# Patient Record
Sex: Male | Born: 1955 | Race: Black or African American | Hispanic: No | Smoking: Current some day smoker
Health system: Southern US, Community
[De-identification: ages and names within clinical notes are randomized; demographics above are authoritative.]

## PROBLEM LIST (undated history)

## (undated) DIAGNOSIS — K219 Gastro-esophageal reflux disease without esophagitis: Secondary | ICD-10-CM

## (undated) DIAGNOSIS — I10 Essential (primary) hypertension: Secondary | ICD-10-CM

## (undated) DIAGNOSIS — E785 Hyperlipidemia, unspecified: Secondary | ICD-10-CM

## (undated) HISTORY — DX: Hyperlipidemia, unspecified: E78.5

## (undated) HISTORY — DX: Gastro-esophageal reflux disease without esophagitis: K21.9

## (undated) HISTORY — PX: HERNIA REPAIR: SHX51

## (undated) HISTORY — DX: Essential (primary) hypertension: I10

---

## 2019-09-23 ENCOUNTER — Encounter (HOSPITAL_COMMUNITY): Payer: Self-pay | Admitting: Emergency Medicine

## 2019-09-23 ENCOUNTER — Other Ambulatory Visit: Payer: Self-pay

## 2019-09-23 ENCOUNTER — Ambulatory Visit (HOSPITAL_COMMUNITY)
Admission: EM | Admit: 2019-09-23 | Discharge: 2019-09-23 | Disposition: A | Discharge status: Home / Self Care | Payer: Medicaid Other

## 2019-09-23 DIAGNOSIS — S81811A Laceration without foreign body, right lower leg, initial encounter: Secondary | ICD-10-CM

## 2019-09-23 NOTE — ED Triage Notes (Signed)
Pt presents to [facility] for assessment after he was holding a mirror with suction cups, and it slipped off and he was rattled trying to catch it before it hit the ground last week.  Patient states he hascramping to the left thigh.  Also, laceration to the right shin, bleeding controlled./

## 2019-09-23 NOTE — Discharge Instructions (Addendum)
The wound is healing on its own and is not infected. Since it has been a week we cannot sew the wound up. Keep clean and covered

## 2019-09-23 NOTE — ED Provider Notes (Signed)
[facility]    CSN: [id number] Arrival date & time: 09/23/19  1058      History   Chief Complaint Chief Complaint  Patient presents with  . Laceration    HPI Randall Velasquez is a 64 y.o. male.   Patient is a 64 year old male who presents today with laceration to right lower extremity.  This occurred approximately 1 week ago when he was holding a mirror and cut the bottom of his leg.  Otherwise he has no complaints.  Denies any significant pain, redness, swelling to the site.  He reports he is up-to-date on his tetanus.     History reviewed. No pertinent past medical history.  There are no problems to display for this patient.   History reviewed. No pertinent surgical history.     Home Medications    Prior to Admission medications   Not on File    Family History History reviewed. No pertinent family history.  Social History Social History   Tobacco Use  . Smoking status: Current Every Day Smoker    Packs/day: 2.00    Types: Cigars  . Smokeless tobacco: Never Used  Substance Use Topics  . Alcohol use: Yes    Comment: ever now and again  . Drug use: Not Currently     Allergies   Patient has no known allergies.   Review of Systems Review of Systems   Physical Exam Triage Vital Signs ED Triage Vitals [09/23/19 1254]  Enc Vitals Group     BP (!) 154/95     Pulse Rate 61     Resp 18     Temp 98 F (36.7 C)     Temp Source Oral     SpO2 98 %     Weight      Height      Head Circumference      Peak Flow      Pain Score 10     Pain Loc      Pain Edu?      Excl. in GC?    No data found.  Updated Vital Signs BP (!) 154/95 (BP Location: Right Arm)   Pulse 61   Temp 98 F (36.7 C) (Oral)   Resp 18   SpO2 98%   Visual Acuity Right Eye Distance:   Left Eye Distance:   Bilateral Distance:    Right Eye Near:   Left Eye Near:    Bilateral Near:     Physical Exam Vitals and nursing note reviewed.  Constitutional:       Appearance: Normal appearance.  HENT:     Head: Normocephalic and atraumatic.     Nose: Nose normal.  Eyes:     Conjunctiva/sclera: Conjunctivae normal.  Pulmonary:     Effort: Pulmonary effort is normal.  Musculoskeletal:        General: Normal range of motion.     Cervical back: Normal range of motion.  Skin:    General: Skin is warm and dry.       Neurological:     Mental Status: He is alert.  Psychiatric:        Mood and Affect: Mood normal.      UC Treatments / Results  Labs (all labs ordered are listed, but only abnormal results are displayed) Labs Reviewed - No data to display  EKG   Radiology No results found.  Procedures Procedures (including critical care time)  Medications Ordered in UC Medications - No data  to display  Initial Impression / Assessment and Plan / UC Course  I have reviewed the triage vital signs and the nursing notes.  Pertinent labs & imaging results that were available during my care of the patient were reviewed by me and considered in my medical decision making (see chart for details).     Laceration to right lower extremity that is healing No concerns for infection. Out of the window for sutures. Recommend keep clean and wrapped Follow up as needed for continued or worsening symptoms  Final Clinical Impressions(s) / UC Diagnoses   Final diagnoses:  Laceration of right lower extremity, initial encounter     Discharge Instructions     The wound is healing on its own and is not infected. Since it has been a week we cannot sew the wound up. Keep clean and covered    ED Prescriptions    None     PDMP not reviewed this encounter.   Janace Aris, NP 09/23/19 1333

## 2019-09-30 DIAGNOSIS — G8929 Other chronic pain: Secondary | ICD-10-CM | POA: Diagnosis not present

## 2019-09-30 DIAGNOSIS — K219 Gastro-esophageal reflux disease without esophagitis: Secondary | ICD-10-CM | POA: Diagnosis not present

## 2019-09-30 DIAGNOSIS — I10 Essential (primary) hypertension: Secondary | ICD-10-CM | POA: Diagnosis not present

## 2019-09-30 DIAGNOSIS — M546 Pain in thoracic spine: Secondary | ICD-10-CM | POA: Diagnosis not present

## 2019-09-30 DIAGNOSIS — R229 Localized swelling, mass and lump, unspecified: Secondary | ICD-10-CM | POA: Diagnosis not present

## 2019-09-30 DIAGNOSIS — S81811S Laceration without foreign body, right lower leg, sequela: Secondary | ICD-10-CM | POA: Diagnosis not present

## 2019-10-08 DIAGNOSIS — I10 Essential (primary) hypertension: Secondary | ICD-10-CM | POA: Diagnosis not present

## 2019-10-08 DIAGNOSIS — R252 Cramp and spasm: Secondary | ICD-10-CM | POA: Diagnosis not present

## 2019-10-08 DIAGNOSIS — R7989 Other specified abnormal findings of blood chemistry: Secondary | ICD-10-CM | POA: Diagnosis not present

## 2019-10-16 DIAGNOSIS — R944 Abnormal results of kidney function studies: Secondary | ICD-10-CM | POA: Diagnosis not present

## 2019-11-13 DIAGNOSIS — R220 Localized swelling, mass and lump, head: Secondary | ICD-10-CM | POA: Diagnosis not present

## 2020-01-04 DIAGNOSIS — Z23 Encounter for immunization: Secondary | ICD-10-CM | POA: Diagnosis not present

## 2020-01-04 DIAGNOSIS — N289 Disorder of kidney and ureter, unspecified: Secondary | ICD-10-CM | POA: Diagnosis not present

## 2020-01-04 DIAGNOSIS — M545 Low back pain, unspecified: Secondary | ICD-10-CM | POA: Diagnosis not present

## 2020-01-04 DIAGNOSIS — I10 Essential (primary) hypertension: Secondary | ICD-10-CM | POA: Diagnosis not present

## 2020-01-25 DIAGNOSIS — N289 Disorder of kidney and ureter, unspecified: Secondary | ICD-10-CM | POA: Diagnosis not present

## 2020-01-25 DIAGNOSIS — I10 Essential (primary) hypertension: Secondary | ICD-10-CM | POA: Diagnosis not present

## 2020-01-25 DIAGNOSIS — G8929 Other chronic pain: Secondary | ICD-10-CM | POA: Diagnosis not present

## 2020-01-25 DIAGNOSIS — I7300 Raynaud's syndrome without gangrene: Secondary | ICD-10-CM | POA: Diagnosis not present

## 2020-01-25 DIAGNOSIS — M545 Low back pain, unspecified: Secondary | ICD-10-CM | POA: Diagnosis not present

## 2020-02-13 DIAGNOSIS — D689 Coagulation defect, unspecified: Secondary | ICD-10-CM

## 2020-02-13 HISTORY — DX: Coagulation defect, unspecified: D68.9

## 2020-05-05 DIAGNOSIS — I739 Peripheral vascular disease, unspecified: Secondary | ICD-10-CM | POA: Diagnosis not present

## 2020-05-05 DIAGNOSIS — I10 Essential (primary) hypertension: Secondary | ICD-10-CM | POA: Diagnosis not present

## 2020-05-05 DIAGNOSIS — N289 Disorder of kidney and ureter, unspecified: Secondary | ICD-10-CM | POA: Diagnosis not present

## 2020-05-09 ENCOUNTER — Other Ambulatory Visit: Payer: Self-pay | Admitting: Internal Medicine

## 2020-05-09 DIAGNOSIS — I739 Peripheral vascular disease, unspecified: Secondary | ICD-10-CM

## 2020-05-12 ENCOUNTER — Ambulatory Visit
Admission: RE | Admit: 2020-05-12 | Discharge: 2020-05-12 | Disposition: A | Discharge status: Home / Self Care | Payer: Medicaid Other | Source: Ambulatory Visit | Attending: Internal Medicine | Admitting: Internal Medicine

## 2020-05-12 DIAGNOSIS — I70213 Atherosclerosis of native arteries of extremities with intermittent claudication, bilateral legs: Secondary | ICD-10-CM | POA: Diagnosis not present

## 2020-05-12 DIAGNOSIS — I739 Peripheral vascular disease, unspecified: Secondary | ICD-10-CM

## 2020-05-19 DIAGNOSIS — I739 Peripheral vascular disease, unspecified: Secondary | ICD-10-CM | POA: Insufficient documentation

## 2020-05-26 DIAGNOSIS — G8929 Other chronic pain: Secondary | ICD-10-CM | POA: Insufficient documentation

## 2020-05-26 DIAGNOSIS — Z131 Encounter for screening for diabetes mellitus: Secondary | ICD-10-CM | POA: Diagnosis not present

## 2020-05-26 DIAGNOSIS — F17200 Nicotine dependence, unspecified, uncomplicated: Secondary | ICD-10-CM | POA: Insufficient documentation

## 2020-05-26 DIAGNOSIS — Z13220 Encounter for screening for lipoid disorders: Secondary | ICD-10-CM | POA: Diagnosis not present

## 2020-05-30 DIAGNOSIS — N183 Chronic kidney disease, stage 3 unspecified: Secondary | ICD-10-CM | POA: Insufficient documentation

## 2020-07-08 DIAGNOSIS — I739 Peripheral vascular disease, unspecified: Secondary | ICD-10-CM | POA: Diagnosis not present

## 2020-09-27 ENCOUNTER — Ambulatory Visit (INDEPENDENT_AMBULATORY_CARE_PROVIDER_SITE_OTHER): Payer: Medicaid Other

## 2020-09-27 ENCOUNTER — Other Ambulatory Visit: Payer: Self-pay

## 2020-09-27 ENCOUNTER — Encounter: Payer: Self-pay | Admitting: Family Medicine

## 2020-09-27 ENCOUNTER — Ambulatory Visit (INDEPENDENT_AMBULATORY_CARE_PROVIDER_SITE_OTHER): Payer: Medicaid Other | Admitting: Family Medicine

## 2020-09-27 ENCOUNTER — Ambulatory Visit: Payer: Self-pay

## 2020-09-27 VITALS — BP 102/74 | HR 57 | Ht 71.0 in | Wt 168.2 lb

## 2020-09-27 DIAGNOSIS — G8929 Other chronic pain: Secondary | ICD-10-CM | POA: Diagnosis not present

## 2020-09-27 DIAGNOSIS — I739 Peripheral vascular disease, unspecified: Secondary | ICD-10-CM

## 2020-09-27 DIAGNOSIS — M25562 Pain in left knee: Secondary | ICD-10-CM

## 2020-09-27 DIAGNOSIS — M545 Low back pain, unspecified: Secondary | ICD-10-CM | POA: Diagnosis not present

## 2020-09-27 DIAGNOSIS — M5416 Radiculopathy, lumbar region: Secondary | ICD-10-CM

## 2020-09-27 MED ORDER — PREGABALIN 75 MG PO CAPS: 75.0000 mg | ORAL_CAPSULE | Freq: Two times a day (BID) | ORAL | 3 refills | Status: DC | PRN

## 2020-09-27 NOTE — Progress Notes (Signed)
Subjective:    CC: L knee pain  I, Randall Velasquez, LAT, ATC, am serving as scribe for Dr. [name].  HPI: Pt is a 65 y/o male presenting w/ c/o L knee pain x one year w/ no known MOI .  He locates his pain to his L ant-lat knee w/ radiating pain into his L post-lat calf .  Patient has had some treatment for this with his primary care provider at [facility] since October 2021 with trials of home exercise program, and trial of gabapentin with little benefit.  Ultimately he did have some evaluation and was determined to have peripheral arterial disease and is currently followed by [facility] cardiology and managed with Pletal.  He does have exertional calf pain.  However his pain is worsening and feels different than it has felt previously.  He notes paresthesias in his leg as well.  L knee swelling: intermittently yes L knee mechanical symptoms: yes L LE numbness/tingling: yes from his L foot travelling proximally to his L lower leg Aggravating factors: walking; climbing stairs; pain worse at night Treatments tried: Gabapentin; Tylenol; prior L knee steroid injection years ago  Pertinent review of Systems: No fevers or chills  Relevant historical information: History of PAD involving left leg with ABI 0.9 with monophasic waveform.  Evaluated by [facility] cardiology with plan for medical management with Pletal.  Patient is not satisfied and wishes for second opinion/to transfer care to [facility] physician   Objective:    Vitals:   09/27/20 0919  BP: 102/74  Pulse: (!) 57  SpO2: 97%   General: Well Developed, well nourished, and in no acute distress.   MSK:  L-spine: Nontender midline.  Decreased lumbar motion. Positive left-sided slump test. Intact strength lower extremity. Left hip normal.  Normal motion nontender. Left knee mild effusion normal motion with crepitation.  Tender palpation medial joint line. Stable to commence exam. Intact strength. Left calf normal.   Nontender. Decreased pulses left leg.   Lab and Radiology Results  Procedure: Real-time Ultrasound Guided Injection of left knee superior lateral patellar space Device: Philips Affiniti 50G Images permanently stored and available for review in PACS Verbal informed consent obtained.  Discussed risks and benefits of procedure. Warned about infection bleeding damage to structures skin hypopigmentation and fat atrophy among others. Patient expresses understanding and agreement Time-out conducted.   Noted no overlying erythema, induration, or other signs of local infection.   Skin prepped in a sterile fashion.   Local anesthesia: Topical Ethyl chloride.   With sterile technique and under real time ultrasound guidance: 40 mg of Kenalog and 2 mL of Marcaine injected into knee joint. Fluid seen entering the joint capsule.   Completed without difficulty   Pain immediately resolved suggesting accurate placement of the medication.   Advised to call if fevers/chills, erythema, induration, drainage, or persistent bleeding.   Images permanently stored and available for review in the ultrasound unit.  Impression: Technically successful ultrasound guided injection.   X-ray images left knee and L-spine obtained today personally and independently interpreted  L-spine: Diffuse degenerative disc disease lower portion lumbar spine around L4-5 and L5-S1 with facet DJD.  No acute fractures.  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  No acute fractures.  Await formal radiology review    [place] ART 05/12/20 EXAM: NONINVASIVE PHYSIOLOGIC VASCULAR STUDY OF BILATERAL LOWER EXTREMITIES   TECHNIQUE: Non-invasive vascular evaluation of both lower extremities was performed at rest, including calculation of ankle-brachial indices, multiple segmental  pressure evaluation, segmental Doppler and segmental pulse volume recording.   COMPARISON:  None.   FINDINGS: Right Lower  Extremity   Resting ABI:  1.05   Resting TBI: 0.67   Segmental Pressures: Normal segmental pressures, no significant (20 mmHg) pressure gradient between adjacent segments. Great toe pressure: 89   Arterial Waveforms: Normal tri-phasic arterial waveforms.   PVRs: Normal PVRs with maintained waveform amplitude, augmentation and quality.   Left Lower Extremity:   Resting ABI: 0.90   Resting TBI: 0.58   Segmental Pressures: Normal segmental pressures, no significant (20 mmHg) pressure gradient between adjacent segments. Great toe pressure: 76   Arterial Waveforms: Monophasic waveforms throughout.   PVRs: Normal PVRs with maintained waveform amplitude, augmentation and quality.   Other: Symmetric upper extremity pressures.   IMPRESSION: 1. Mild left lower extremity peripheral artery disease (ABI = 0.90) and monophasic waveforms throughout the left lower extremity compatible with inflow disease. Consider CTA abdomen and pelvis with bilateral lower extremity runoff for further characterization. 2. No evidence of significant right lower extremity peripheral artery disease (ABI = 1.05).   Marliss Coots, MD   [facility]   [facility]     Electronically Signed   By: Marliss Coots MD   On: 05/12/2020 15:37     Impression and Recommendations:    Assessment and Plan: 65 y.o. male with left leg pain multifactorial.  Patient has pain in his left knee and calf and more proximally into his thigh.  This certainly could be related to his peripheral arterial disease.  He does have exertional leg pain that almost certainly is claudication especially given his previous evaluation in March of this year.  Agree with medication management for now.  He would like to transfer care to [facility] vascular.  Referral placed today.  Additionally is possible a lot of his pain is lumbar radicular in nature.  He has had some trial of for treatment  with this previously with his primary care provider with home exercise program and gabapentin with no benefit.  Plan to proceed to further evaluation with lumbar spine x-ray and MRI as he is failing conservative management.  Additionally switch from gabapentin to Lyrica.  Recheck after MRI.  Verlon Au is likely he does have some pain due to knee DJD.  Plan for knee x-ray and injection today.  Check as above.Marland Kitchen  PDMP not reviewed this encounter. Orders Placed This Encounter  Procedures   [place] LIMITED JOINT SPACE STRUCTURES LOW LEFT(NO LINKED CHARGES)    Order Specific Question:   Reason for Exam (SYMPTOM  OR DIAGNOSIS REQUIRED)    Answer:   L knee pain    Order Specific Question:   Preferred imaging location?    Answer:   [facility] Lumbar Spine 2-3 Views    Standing Status:   Future    Number of Occurrences:   1    Standing Expiration Date:   09/27/2021    Order Specific Question:   Reason for Exam (SYMPTOM  OR DIAGNOSIS REQUIRED)    Answer:   eval left lumbar rad    Order Specific Question:   Preferred imaging location?    Answer:   [facility]   DG Knee AP/LAT W/Sunrise Left    Standing Status:   Future    Number of Occurrences:   1    Standing Expiration Date:   09/27/2021    Order Specific Question:   Reason for Exam (SYMPTOM  OR DIAGNOSIS REQUIRED)    Answer:   eval left knee pain    Order Specific Question:   Preferred imaging location?    Answer:   [facility]   MR Lumbar Spine Wo Contrast    Standing Status:   Future    Standing Expiration Date:   09/27/2021    Order Specific Question:   What is the patient's sedation requirement?    Answer:   No Sedation    Order Specific Question:   Does the patient have a pacemaker or implanted devices?    Answer:   No    Order Specific Question:   Preferred imaging location?    Answer:   [facility] (table limit-550lbs)   Ambulatory referral to Vascular Surgery    Referral Priority:    Routine    Referral Type:   Surgical    Referral Reason:   Specialty Services Required    Requested Specialty:   Vascular Surgery    Number of Visits Requested:   1   Meds ordered this encounter  Medications   pregabalin (LYRICA) 75 MG capsule    Sig: Take 1 capsule (75 mg total) by mouth 2 (two) times daily as needed.    Dispense:  60 capsule    Refill:  3     Discussed warning signs or symptoms. Please see discharge instructions. Patient expresses understanding.   The above documentation has been reviewed and is accurate and complete [name], M.D.  Total encounter time 40 minutes including face-to-face time with the patient and, reviewing past medical record, and charting on the date of service.  Time excludes time to perform injection.

## 2020-09-27 NOTE — Patient Instructions (Signed)
Thank you for coming in today.   Please get an Xray today before you leave   You should hear from MRI scheduling within 1 week. If you do not hear please let me know.    Please use Voltaren gel (Generic Diclofenac Gel) up to 4x daily for pain as needed.  This is available over-the-counter as both the name brand Voltaren gel and the generic diclofenac gel.   STOP gabapentin.   Try lyrica maybe just at bedtime. This is just for nerve pain.   I have also referred you to the [facility]. You should hear from them soon. You can call them as well.Hennie Duos with me after the MRI is back.

## 2020-09-28 NOTE — Progress Notes (Signed)
Left knee x-ray shows some mild arthritis changes.

## 2020-09-28 NOTE — Progress Notes (Signed)
Lumbar spine x-ray shows mild arthritis changes at the base of the spine.  No acute fractures are visible.

## 2020-10-09 ENCOUNTER — Other Ambulatory Visit: Payer: Medicaid Other

## 2020-10-10 ENCOUNTER — Telehealth: Payer: Self-pay | Admitting: Family Medicine

## 2020-10-10 NOTE — Telephone Encounter (Signed)
Initial peer to peer for MRI lumbar spine was denied.  We need more details about what exercises Randall Velasquez has been doing.  I  An example would be something like back strengthening exercises as directed by his primary care physician 3 times a week for 20 minutes a day starting from December 2021 until now.

## 2020-10-11 ENCOUNTER — Encounter: Payer: Self-pay | Admitting: Physical Therapy

## 2020-10-11 NOTE — Telephone Encounter (Signed)
Called pt and unable to reach him or leave a message due to him not having a voicemail.

## 2020-10-11 NOTE — Telephone Encounter (Signed)
Letter has been mailed to pt as he does not answer his phone or have a voicemail.

## 2020-10-11 NOTE — Telephone Encounter (Signed)
Can one of you please call the pt?

## 2020-10-15 ENCOUNTER — Other Ambulatory Visit: Payer: Medicaid Other

## 2020-10-19 NOTE — Telephone Encounter (Signed)
Pt received letter, no longer has a phone. He should be contacted via his niece/Paulette. Updated info in [place], Paulette to have patient call us.

## 2020-11-08 NOTE — Progress Notes (Signed)
I, Christoper Fabian, LAT, ATC, am serving as scribe for Dr. [name].  Randall Velasquez is a 65 y.o. male who presents to [facility] today for f/u of L leg pain and paresthesias.  He was last seen by Dr. Denyse Amass on 09/27/20 and had a L knee steroid injection and was prescribed Lyrica.  Due to some trial of treatment with this previously with his primary care provider with home exercise program and gabapentin with no benefit, L-spine XR and MRI were ordered.  Today, pt reports legs are really hurting and down to heels. Pt c/o severe cramps. Pt notes he got a "burn" from using icy hot and then being out in the sun. Pt notes the muscle relaxer is not helping. Pt has not been taking the Lyrica due to stomach irritation. Pain is mostly located in the left leg extending from the left lateral leg to the left lateral calf to the posterior and plantar heel.  He notes some weakness to foot dorsiflexion and great toe dorsiflexion as well.  He has been doing his home exercises originally taught by primary care physician December 2021 and reinforced by me at the last visit September 27, 2020.  He does them typically 3 times a week for about 20 minutes a day.  These have not been very helpful.  He is trying core stabilization exercises and knee extension and ankle strengthening exercises.  Patient will be getting new insurance in about 2 weeks.   Diagnostic testing: L-spine and L knee XR- 09/27/20  Pertinent review of systems: No fevers or chills.  Bothersome nocturnal cramping.  Relevant historical information: Mild PAD left leg on ABI March 2022   Exam:  BP (!) 158/100   Pulse (!) 51   Ht 5\' 11"  (1.803 m)   Wt 169 lb 3.2 oz (76.7 kg)   SpO2 97%   BMI 23.60 kg/m  General: Well Developed, well nourished, and in no acute distress.   MSK: L-spine nontender midline.  Decreased lumbar motion. Positive left-sided slump test. Lower extremity strength intact with exception of  left foot dorsiflexion 4/5 and great toe dorsiflexion 4/5.  Otherwise strength is intact. Reflexes intact. Sensation is intact.    Lab and Radiology Results EXAM: LUMBAR SPINE - 3 VIEW   COMPARISON:  None.   FINDINGS: Five lumbar type vertebral bodies are well visualized. Vertebral body height is well maintained. Mild osteophytic changes are seen. No acute soft tissue abnormality is noted.   IMPRESSION: Mild degenerative change without acute abnormality.     Electronically Signed   By: M.D.   On: 09/27/2020 19:42   I, 09/29/2020, personally (independently) visualized and performed the interpretation of the images attached in this note.     Assessment and Plan: 65 y.o. male with left leg pain thought to be due to lumbar radiculopathy. Has had extensive work-up and trials of conservative management already.  The obvious neck step is lumbar MRI.  This was attempted at the last visit about 6 weeks ago September 27, 2020.  However MRI was declined by his current insurance because we did not have sufficient documentation for his detailed conservative management exercise program.  Unfortunately I was unable to get in contact with [place] in the interim to confirm with him the exercise program.  After today I did confirm that he has been completing home exercise program since December 2021 and throughout his last 6 weeks including exercises listed above. Additionally  he is developed some mild left leg weakness and pain consistent with left L5 lumbar radiculopathy.  We will plan for lumbar MRI.  However at this point there is a bit of a wrinkle.  He will be switching insurances in 2 weeks.  I think it is unlikely that I will be able to get MRI authorized and completed in 2 weeks before his insurance changes.  Discussed the plan with the patient and Paulette his niece who is a significant medical decision maker for him and we both agreed to wait until new insurance arrives and then  authorize the MRI with the new insurance.  In the meantime plan for course of prednisone.  Gabapentin and Lyrica ineffective so we will try baclofen at bedtime as this may help with his nocturnal cramping.   PDMP not reviewed this encounter. No orders of the defined types were placed in this encounter.  Meds ordered this encounter  Medications   predniSONE (STERAPRED UNI-PAK 48 TAB) 10 MG (48) TBPK tablet    Sig: Take by mouth daily. 12 day dosepack po    Dispense:  48 tablet    Refill:  0   baclofen (LIORESAL) 10 MG tablet    Sig: Take 1 tablet (10 mg total) by mouth at bedtime as needed for muscle spasms.    Dispense:  60 each    Refill:  1     Discussed warning signs or symptoms. Please see discharge instructions. Patient expresses understanding.   The above documentation has been reviewed and is accurate and complete [name], M.D.   Total encounter time 30 minutes including face-to-face time with the patient and, reviewing past medical record, and charting on the date of service.   Treatment plan and options

## 2020-11-09 ENCOUNTER — Ambulatory Visit (INDEPENDENT_AMBULATORY_CARE_PROVIDER_SITE_OTHER): Payer: Medicare Other | Admitting: Family Medicine

## 2020-11-09 ENCOUNTER — Other Ambulatory Visit: Payer: Self-pay

## 2020-11-09 VITALS — BP 158/100 | HR 51 | Ht 71.0 in | Wt 169.2 lb

## 2020-11-09 DIAGNOSIS — M5416 Radiculopathy, lumbar region: Secondary | ICD-10-CM | POA: Diagnosis not present

## 2020-11-09 MED ORDER — PREDNISONE 10 MG (48) PO TBPK: ORAL_TABLET | Freq: Every day | ORAL | 0 refills | Status: DC

## 2020-11-09 MED ORDER — BACLOFEN 10 MG PO TABS: 10.0000 mg | ORAL_TABLET | Freq: Every evening | ORAL | 1 refills | Status: DC | PRN

## 2020-11-09 NOTE — Patient Instructions (Addendum)
Thank you for coming in today.   Once you get the new insurance card, please send [place] a copy of the front and back of the card ASAP.  I'll order the MRI once we get a copy of your card.  Take Baclofen at bedtime.  Please sign-up for MyChart so you can more easily communicate with [place].  Follow-up after your MRI.

## 2020-11-14 ENCOUNTER — Other Ambulatory Visit: Payer: Self-pay

## 2020-11-14 ENCOUNTER — Telehealth: Payer: Self-pay | Admitting: Family Medicine

## 2020-11-14 DIAGNOSIS — M5416 Radiculopathy, lumbar region: Secondary | ICD-10-CM

## 2020-11-14 NOTE — Telephone Encounter (Signed)
Patient's niece called to let Dr Denyse Amass know that the patient now has [facility] insurance (so that an MRI can be ordered). I have added this to his chart.  ID # [id number]

## 2020-11-22 NOTE — Telephone Encounter (Signed)
New lumbar MRI ordered.  Please reauthorize with the new insurance.

## 2020-11-22 NOTE — Addendum Note (Signed)
Addended by: Rodolph Bong on: 11/22/2020 09:24 AM   Modules accepted: Orders

## 2020-11-23 NOTE — Telephone Encounter (Signed)
Spoke to patients niece. She asked that this be ordered through [facility] due to his transportation.

## 2020-11-23 NOTE — Telephone Encounter (Signed)
Just checked order and it has been ordered to [facility] and scheduled for 11/30/20 so everything should be good.

## 2020-11-29 ENCOUNTER — Encounter: Payer: Self-pay | Admitting: *Deleted

## 2020-11-29 ENCOUNTER — Other Ambulatory Visit: Payer: Self-pay

## 2020-11-29 DIAGNOSIS — I739 Peripheral vascular disease, unspecified: Secondary | ICD-10-CM

## 2020-11-30 ENCOUNTER — Other Ambulatory Visit: Payer: Self-pay

## 2020-11-30 ENCOUNTER — Ambulatory Visit
Admission: RE | Admit: 2020-11-30 | Discharge: 2020-11-30 | Disposition: A | Discharge status: Home / Self Care | Payer: Medicare Other | Source: Ambulatory Visit | Attending: Family Medicine | Admitting: Family Medicine

## 2020-11-30 DIAGNOSIS — M5416 Radiculopathy, lumbar region: Secondary | ICD-10-CM

## 2020-12-01 NOTE — Progress Notes (Signed)
[facility] [place]  ASSESSMENT / PLAN: [name] is a 65 y.o. 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  Patient counseled patients with asymptomatic peripheral arterial disease or claudication have a 1-2% risk of developing chronic limb threatening ischemia, but a 15-30% risk of mortality in the next 5 years. Intervention should only be considered for medically optimized patients with disabling symptoms.   Recommend the following which can slow the progression of atherosclerosis and reduce the risk of major adverse cardiac / limb events:  Complete cessation from all tobacco products. Blood glucose control with goal A1c < 7%. Blood pressure control with goal blood pressure < 140/90 mmHg. Lipid reduction therapy with goal LDL-C <100 mg/dL (<24 if symptomatic from PAD).  Aspirin 81mg  PO QD.  Atorvastatin 40-80mg  PO QD (or other "high intensity" statin therapy). Daily walking to and past the point of discomfort. Patient counseled to keep a log of exercise distance. Adequate hydration (at least 2 liters / day) if patient's heart and kidney function is adequate.  Follow up in 3 months with me or [facility] PA with repeat ABI to monitor symptoms.  CHIEF COMPLAINT: left leg pain  HISTORY OF PRESENT ILLNESS: Randall Velasquez is a 65 y.o. male who presents to clinic for evaluation of numbness.  He is a longtime smoker.  He reports fairly typical symptoms of intermittent claudication (left calf cramping after walking a short distance which is relieved by rest).  He does not report typical symptoms of ischemic ulceration.  He reports numbness in his bilateral feet and bilateral index fingers.  Does not have any ulcers about his feet.  VASCULAR SURGICAL HISTORY: none  VASCULAR RISK FACTORS: Negative history of stroke / transient ischemic attack. Negative history of coronary artery disease.  Negative history  of diabetes mellitus.  Positive history of smoking. + actively smoking. Positive history of hypertension.  Negative history of chronic kidney disease.  Negative history of chronic obstructive pulmonary disease.  FUNCTIONAL STATUS: ECOG performance status: (0) Fully active, able to carry on all predisease performance without restriction Ambulatory status: Ambulatory within the community without limits  Past Medical History:  Diagnosis Date   Hyperlipidemia    Hypertension     Past Surgical History:  Procedure Laterality Date   HERNIA REPAIR     as a child    History reviewed. No pertinent family history.  Social History   Socioeconomic History   Marital status: Single    Spouse name: Not on file   Number of children: Not on file   Years of education: Not on file   Highest education level: Not on file  Occupational History   Not on file  Tobacco Use   Smoking status: Every Day    Packs/day: 2.00    Types: Cigars, Cigarettes   Smokeless tobacco: Never  Substance and Sexual Activity   Alcohol use: Yes    Comment: ever now and again   Drug use: Not Currently   Sexual activity: Not on file  Other Topics Concern   Not on file  Social History Narrative   Not on file   Social Determinants of Health   Financial Resource Strain: Not on file  Food Insecurity: Not on file  Transportation Needs: Not on file  Physical Activity: Not on file  Stress: Not on file  Social Connections: Not on file  Intimate Partner Violence: Not on file    No Known Allergies  Current Outpatient Medications  Medication Sig Dispense Refill   aspirin EC 81 MG tablet Take 81 mg by mouth daily. Swallow whole.     atorvastatin (LIPITOR) 10 MG tablet Take 10 mg by mouth daily.     baclofen (LIORESAL) 10 MG tablet Take 1 tablet (10 mg total) by mouth at bedtime as needed for muscle spasms. 60 each 1   cilostazol (PLETAL) 50 MG tablet Take 50 mg by mouth 2 (two) times daily.     losartan  (COZAAR) 25 MG tablet Take 25 mg by mouth daily.     pantoprazole (PROTONIX) 20 MG tablet Take 20 mg by mouth daily.     predniSONE (STERAPRED UNI-PAK 48 TAB) 10 MG (48) TBPK tablet Take by mouth daily. 12 day dosepack po 48 tablet 0   No current facility-administered medications for this visit.    REVIEW OF SYSTEMS:  [X]  denotes positive finding, [ ]  denotes negative finding Cardiac  Comments:  Chest pain or chest pressure:    Shortness of breath upon exertion:    Short of breath when lying flat:    Irregular heart rhythm:        Vascular    Pain in calf, thigh, or hip brought on by ambulation: x   Pain in feet at night that wakes you up from your sleep:     Blood clot in your veins:    Leg swelling:         Pulmonary    Oxygen at home:    Productive cough:     Wheezing:         Neurologic    Sudden weakness in arms or legs:     Sudden numbness in arms or legs:     Sudden onset of difficulty speaking or slurred speech:    Temporary loss of vision in one eye:     Problems with dizziness:         Gastrointestinal    Blood in stool:     Vomited blood:         Genitourinary    Burning when urinating:     Blood in urine:        Psychiatric    Major depression:         Hematologic    Bleeding problems:    Problems with blood clotting too easily:        Skin    Rashes or ulcers:        Constitutional    Fever or chills:      PHYSICAL EXAM Vitals:   12/02/20 1354  BP: 130/81  Pulse: 73  Resp: 20  Temp: 98.1 F (36.7 C)  SpO2: 94%  Weight: 173 lb (78.5 kg)  Height: 5\' 11"  (1.803 m)    Constitutional: Well appearing. no distress. Appears well nourished.  Neurologic: CN intact. no focal findings. no sensory loss. Psychiatric:  Mood and affect symmetric and appropriate. Eyes:  No icterus. No conjunctival pallor. Ears, nose, throat:  mucous membranes moist. Midline trachea.  Cardiac: regular rate and rhythm.  Respiratory:  unlabored. Abdominal:  soft,  non-tender, non-distended.  Peripheral vascular: 1+ R DP. Absent L DP. Extremity: no edema. no cyanosis. no pallor.  Skin: no gangrene. no ulceration.  Lymphatic: no Stemmer's sign. no palpable lymphadenopathy.  PERTINENT LABORATORY AND RADIOLOGIC DATA   +-------+-----------+-----------+------------+------------+  ABI/TBIToday's ABIToday's TBIPrevious ABIPrevious TBI  +-------+-----------+-----------+------------+------------+  Right  1.06       0.73  1.05        0.67          +-------+-----------+-----------+------------+------------+  Left   0.65       0.62       0.90        0.58          +-------+-----------+-----------+------------+------------+   Rande Brunt. Lenell Antu, MD [facility] Phone Number: [phone] 12/02/2020 1:59 PM  Total time spent on preparing this encounter including chart review, data review, collecting history, examining the patient, coordinating care for this new patient, 60 minutes.  Portions of this report may have been transcribed using voice recognition software.  Every effort has been made to ensure accuracy; however, inadvertent computerized transcription errors may still be present.

## 2020-12-01 NOTE — Progress Notes (Signed)
MRI shows pinched nerve in the lumbar spine that could affect the L5 nerve root on both sides.  Other levels could be affected as well.  Recommend return to clinic to go over the results of full detail and discuss next treatment options including possible back injections.

## 2020-12-02 ENCOUNTER — Other Ambulatory Visit: Payer: Self-pay

## 2020-12-02 ENCOUNTER — Ambulatory Visit (INDEPENDENT_AMBULATORY_CARE_PROVIDER_SITE_OTHER): Payer: Medicare Other | Admitting: Vascular Surgery

## 2020-12-02 ENCOUNTER — Encounter: Payer: Self-pay | Admitting: Vascular Surgery

## 2020-12-02 ENCOUNTER — Ambulatory Visit (HOSPITAL_COMMUNITY)
Admission: RE | Admit: 2020-12-02 | Discharge: 2020-12-02 | Disposition: A | Discharge status: Home / Self Care | Payer: Medicare Other | Source: Ambulatory Visit | Attending: Vascular Surgery | Admitting: Vascular Surgery

## 2020-12-02 VITALS — BP 130/81 | HR 73 | Temp 98.1°F | Resp 20 | Ht 71.0 in | Wt 173.0 lb

## 2020-12-02 DIAGNOSIS — I739 Peripheral vascular disease, unspecified: Secondary | ICD-10-CM | POA: Diagnosis present

## 2020-12-02 DIAGNOSIS — I70212 Atherosclerosis of native arteries of extremities with intermittent claudication, left leg: Secondary | ICD-10-CM

## 2020-12-05 ENCOUNTER — Ambulatory Visit: Payer: Medicare Other | Admitting: Family Medicine

## 2020-12-05 ENCOUNTER — Other Ambulatory Visit: Payer: Self-pay

## 2020-12-05 DIAGNOSIS — I70212 Atherosclerosis of native arteries of extremities with intermittent claudication, left leg: Secondary | ICD-10-CM

## 2020-12-05 NOTE — Progress Notes (Deleted)
I, Philbert Riser, LAT, ATC acting as a scribe for [name], MD.  Randall Velasquez is a 65 y.o. male who presents to [facility] at [facility] today for f/u L leg pain thought to be due to lumbar radiculopathy and L-spine MRI review. Pt was last seen by Dr. Denyse Amass on 11/09/20 and was advised to wait until pt got his new insurance and then proceed to MRI. Today, pt reports  Dx imaging: 11/30/20 L-spine MRI  09/27/20 L knee & L-spine XR  Pertinent review of systems: ***  Relevant historical information: ***   Exam:  There were no vitals taken for this visit. General: Well Developed, well nourished, and in no acute distress.   MSK: ***    Lab and Radiology Results No results found for this or any previous visit (from the past 72 hour(s)). VAS [place] ABI WITH/WO TBI  Result Date: 12/02/2020  LOWER EXTREMITY DOPPLER STUDY Patient Name:  Randall Velasquez  Date of Exam:   12/02/2020 Medical Rec #: [id number]           Accession #:    [id number] Date of Birth: 25-Nov-1955           Patient Gender: M Patient Age:   23 years Exam Location:  [facility] Procedure:      VAS [place] ABI WITH/WO TBI Referring Phys: --------------------------------------------------------------------------------  Indications: Peripheral artery disease, and Cluadication of the left lower              extremity. Patient reports burning and cramping with ambulation of              approximately 1 block. High Risk Factors: Hypertension, current smoker.  Performing Technologist: Dorthula Matas RVS, RCS  Examination Guidelines: A complete evaluation includes at minimum, Doppler waveform signals and systolic blood pressure reading at the level of bilateral brachial, anterior tibial, and posterior tibial arteries, when vessel segments are accessible. Bilateral testing is considered an integral part of a complete examination. Photoelectric Plethysmograph (PPG) waveforms and toe systolic pressure  readings are included as required and additional duplex testing as needed. Limited examinations for reoccurring indications may be performed as noted.  ABI Findings: +---------+------------------+-----+---------+--------+ Right    Rt Pressure (mmHg)IndexWaveform Comment  +---------+------------------+-----+---------+--------+ Brachial 143                                      +---------+------------------+-----+---------+--------+ PTA      151               1.06 triphasic         +---------+------------------+-----+---------+--------+ DP       152               1.06 triphasic         +---------+------------------+-----+---------+--------+ Great Toe104               0.73                   +---------+------------------+-----+---------+--------+ +---------+------------------+-----+----------+-------+ Left     Lt Pressure (mmHg)IndexWaveform  Comment +---------+------------------+-----+----------+-------+ Brachial 143                                      +---------+------------------+-----+----------+-------+ PTA      93  0.65 biphasic          +---------+------------------+-----+----------+-------+ DP       77                0.54 monophasic        +---------+------------------+-----+----------+-------+ Great Toe89                0.62                   +---------+------------------+-----+----------+-------+ +-------+-----------+-----------+------------+------------+ ABI/TBIToday's ABIToday's TBIPrevious ABIPrevious TBI +-------+-----------+-----------+------------+------------+ Right  1.06       0.73       1.05        0.67         +-------+-----------+-----------+------------+------------+ Left   0.65       0.62       0.90        0.58         +-------+-----------+-----------+------------+------------+  Right ABIs appear essentially unchanged. Left ABIs appear decreased compared to prior study on 05/12/2020.  Summary: Right: Resting  right ankle-brachial index is within normal range. No evidence of significant right lower extremity arterial disease. The right toe-brachial index is normal. Left: Resting left ankle-brachial index indicates moderate left lower extremity arterial disease. The left toe-brachial index is abnormal.  *See table(s) above for measurements and observations.  Electronically signed by Heath Lark on 12/02/2020 at 5:06:16 PM.    Final        Assessment and Plan: 65 y.o. male with ***   PDMP not reviewed this encounter. No orders of the defined types were placed in this encounter.  No orders of the defined types were placed in this encounter.    Discussed warning signs or symptoms. Please see discharge instructions. Patient expresses understanding.   ***

## 2021-01-09 ENCOUNTER — Ambulatory Visit (INDEPENDENT_AMBULATORY_CARE_PROVIDER_SITE_OTHER): Payer: Medicare Other | Admitting: Family Medicine

## 2021-01-09 ENCOUNTER — Encounter: Payer: Self-pay | Admitting: Family Medicine

## 2021-01-09 ENCOUNTER — Other Ambulatory Visit: Payer: Self-pay

## 2021-01-09 VITALS — BP 134/80 | HR 69 | Ht 71.0 in | Wt 177.2 lb

## 2021-01-09 DIAGNOSIS — I70212 Atherosclerosis of native arteries of extremities with intermittent claudication, left leg: Secondary | ICD-10-CM | POA: Diagnosis not present

## 2021-01-09 DIAGNOSIS — M5416 Radiculopathy, lumbar region: Secondary | ICD-10-CM | POA: Diagnosis not present

## 2021-01-09 DIAGNOSIS — S39012A Strain of muscle, fascia and tendon of lower back, initial encounter: Secondary | ICD-10-CM | POA: Diagnosis not present

## 2021-01-09 MED ORDER — TIZANIDINE HCL 4 MG PO TABS: 4.0000 mg | ORAL_TABLET | Freq: Three times a day (TID) | ORAL | 1 refills | Status: DC | PRN

## 2021-01-09 MED ORDER — KETOROLAC TROMETHAMINE 60 MG/2ML IM SOLN
60.0000 mg | Freq: Once | INTRAMUSCULAR | Status: AC
Start: 2021-01-09 — End: 2021-01-09
  Administered 2021-01-09: 11:00:00 60 mg via INTRAMUSCULAR

## 2021-01-09 MED ORDER — METHYLPREDNISOLONE ACETATE 80 MG/ML IJ SUSP
80.0000 mg | Freq: Once | INTRAMUSCULAR | Status: AC
  Administered 2021-01-09: 11:00:00 80 mg via INTRAMUSCULAR

## 2021-01-09 NOTE — Patient Instructions (Addendum)
Good to see you today  You had a toradol and steroid injection today for pain.  I've prescribed you Tizanidine.  Follow-up: one month

## 2021-01-09 NOTE — Progress Notes (Signed)
I, Christoper Fabian, LAT, ATC, am serving as scribe for Dr. [name].  Randall Velasquez is a 65 y.o. male who presents to [facility] at [facility] today for f/u of chronic LBP.  He was last seen by Dr. Denyse Amass on 11/09/20 for f/u of L leg pain from the left lateral leg to the left lateral calf to the posterior and plantar heel and L LE paresthesias.  He was prescribed baclofen and a short course of prednisone.  Later an L-spine MRI was ordered that he had on 11/30/20 but pt did not f/u after MRI.  Today, pt reports that he is having R-sided low back pain w/ pain radiating into his R thigh.  He is not currently having pain in his L leg as he was previously.  He has finished his prednisone and is no longer taking the Baclofen due to nausea per pt.  Diagnostic testing: L-spine MRI- 11/30/20, L knee and L-spine XR- 09/27/20  Pertinent review of systems: no fever or chills  Relevant historical information: CKD, PAD, HTN   Exam:  BP 134/80 (BP Location: Right Arm, Patient Position: Sitting, Cuff Size: Normal)   Pulse 69   Ht 5\' 11"  (1.803 m)   Wt 177 lb 3.2 oz (80.4 kg)   SpO2 93%   BMI 24.71 kg/m  General: Well Developed, well nourished, and in no acute distress.   MSK: L-spine: Nontender midline.  Tender palpation right lumbar paraspinal musculature.  Decreased lumbar motion.  Lower extremity strength is intact negative slump test.    Lab and Radiology Results EXAM: MRI LUMBAR SPINE WITHOUT CONTRAST   TECHNIQUE: Multiplanar, multisequence MR imaging of the lumbar spine was performed. No intravenous contrast was administered.   COMPARISON:  No prior MRI, correlation is made with 09/27/2020 lumbar spine radiographs.   FINDINGS: Segmentation:  Standard.   Alignment: S shaped curvature of the thoracolumbar spine. Trace retrolisthesis L4 on L5.   Vertebrae:  No fracture, evidence of discitis, or bone lesion.   Conus medullaris and cauda equina: Conus extends to  the L2 level. Conus and cauda equina appear normal.   Paraspinal and other soft tissues: The left kidney is not visualized.   Disc levels:   T12-L1: No significant disc bulge. No spinal canal stenosis or neural foraminal narrowing.   L1-L2: No significant disc bulge. No spinal canal stenosis or neural foraminal narrowing.   L2-L3: Mild disc bulge. Mild facet arthropathy. No spinal canal stenosis. No neural foraminal narrowing.   L3-L4: Broad-based disc bulge. Mild facet arthropathy. No spinal canal stenosis. Mild left neural foraminal narrowing.   L4-L5: Trace retrolisthesis. Broad-based disc bulge. Mild facet arthropathy. Narrowing of the bilateral lateral recesses. Mild right neural foraminal narrowing.   L5-S1: Mild disc bulge. Mild facet arthropathy. No spinal canal stenosis. Moderate left and mild right neural foraminal narrowing.   IMPRESSION: 1. L5-S1 moderate left and mild right neural foraminal narrowing. 2. L4-L5 mild right neural foraminal narrowing, with narrowing of the bilateral lateral recesses, which could affect the descending L5 nerves. 3. L3-L4 mild left neural foraminal narrowing. 4. No spinal canal stenosis.     Electronically Signed   By: 09/29/2020 M.D.   On: 11/30/2020 23:28 I, 12/02/2020, personally (independently) visualized and performed the interpretation of the images attached in this note.    Assessment and Plan: 65 y.o. male with low back pain.  Patient has chronic back pain and now an acute episode of low back pain on  the right side occurring about 6 days ago.  His acute back pain is due to muscle spasm and dysfunction and will improve with a bit of time, tizanidine as prescribed, and the Toradol and Depo-Medrol injections given in clinic.  If not improved PT would be very helpful.  Is chronic low back pain occasionally with lumbar radiculopathy would improve if needed with either an epidural steroid injection or facet injections.   These will be ordered if needed in the future.  I spent time today discussing the treatment plan and options as well as the MRI findings with Randall Velasquez and his niece over the phone who expresses understanding and agreement. Total encounter time 30 minutes including face-to-face time with the patient and, reviewing past medical record, and charting on the date of service.      PDMP not reviewed this encounter. No orders of the defined types were placed in this encounter.  Meds ordered this encounter  Medications   tiZANidine (ZANAFLEX) 4 MG tablet    Sig: Take 1 tablet (4 mg total) by mouth every 8 (eight) hours as needed for muscle spasms.    Dispense:  30 tablet    Refill:  1   ketorolac (TORADOL) injection 60 mg   methylPREDNISolone acetate (DEPO-MEDROL) injection 80 mg     Discussed warning signs or symptoms. Please see discharge instructions. Patient expresses understanding.   The above documentation has been reviewed and is accurate and complete [name], M.D.

## 2021-01-23 ENCOUNTER — Other Ambulatory Visit: Payer: Self-pay | Admitting: Family Medicine

## 2021-01-23 NOTE — Telephone Encounter (Signed)
Rx refill request approved per Dr. Corey's orders. 

## 2021-02-07 ENCOUNTER — Other Ambulatory Visit: Payer: Self-pay | Admitting: Family Medicine

## 2021-02-28 ENCOUNTER — Ambulatory Visit: Payer: Medicaid Other | Admitting: Internal Medicine

## 2021-03-06 NOTE — Progress Notes (Deleted)
[facility]  ASSESSMENT / PLAN: Randall Velasquez is a 66 y.o. 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  Patient counseled patients with asymptomatic peripheral arterial disease or claudication have a 1-2% risk of developing chronic limb threatening ischemia, but a 15-30% risk of mortality in the next 5 years. Intervention should only be considered for medically optimized patients with disabling symptoms.   Recommend the following which can slow the progression of atherosclerosis and reduce the risk of major adverse cardiac / limb events:  Complete cessation from all tobacco products. Blood glucose control with goal A1c < 7%. Blood pressure control with goal blood pressure < 140/90 mmHg. Lipid reduction therapy with goal LDL-C <100 mg/dL (<29 if symptomatic from PAD).  Aspirin 81mg  PO QD.  Atorvastatin 40-80mg  PO QD (or other "high intensity" statin therapy). Daily walking to and past the point of discomfort. Patient counseled to keep a log of exercise distance. Adequate hydration (at least 2 liters / day) if patient's heart and kidney function is adequate.  Follow up in 3 months with me or [facility] PA with repeat ABI to monitor symptoms.  CHIEF COMPLAINT: left leg pain  HISTORY OF PRESENT ILLNESS: Randall Velasquez is a 66 y.o. male who presents to clinic for evaluation of numbness.  He is a longtime smoker.  He reports fairly typical symptoms of intermittent claudication (left calf cramping after walking a short distance which is relieved by rest).  He does not report typical symptoms of ischemic ulceration.  He reports numbness in his bilateral feet and bilateral index fingers.  Does not have any ulcers about his feet.  VASCULAR SURGICAL HISTORY: none  VASCULAR RISK FACTORS: Negative history of stroke / transient ischemic attack. Negative history of coronary artery disease.  Negative history  of diabetes mellitus.  Positive history of smoking. + actively smoking. Positive history of hypertension.  Negative history of chronic kidney disease.  Negative history of chronic obstructive pulmonary disease.  FUNCTIONAL STATUS: ECOG performance status: (0) Fully active, able to carry on all predisease performance without restriction Ambulatory status: Ambulatory within the community without limits  Past Medical History:  Diagnosis Date   Hyperlipidemia    Hypertension     Past Surgical History:  Procedure Laterality Date   HERNIA REPAIR     as a child    No family history on file.  Social History   Socioeconomic History   Marital status: Single    Spouse name: Not on file   Number of children: Not on file   Years of education: Not on file   Highest education level: Not on file  Occupational History   Not on file  Tobacco Use   Smoking status: Every Day    Packs/day: 2.00    Types: Cigars, Cigarettes   Smokeless tobacco: Never  Substance and Sexual Activity   Alcohol use: Yes    Comment: ever now and again   Drug use: Not Currently   Sexual activity: Not on file  Other Topics Concern   Not on file  Social History Narrative   Not on file   Social Determinants of Health   Financial Resource Strain: Not on file  Food Insecurity: Not on file  Transportation Needs: Not on file  Physical Activity: Not on file  Stress: Not on file  Social Connections: Not on file  Intimate Partner Violence: Not on file    No Known Allergies  Current  Outpatient Medications  Medication Sig Dispense Refill   aspirin EC 81 MG tablet Take 81 mg by mouth daily. Swallow whole.     atorvastatin (LIPITOR) 10 MG tablet Take 10 mg by mouth daily.     cilostazol (PLETAL) 50 MG tablet Take 50 mg by mouth 2 (two) times daily.     losartan (COZAAR) 25 MG tablet Take 25 mg by mouth daily.     pantoprazole (PROTONIX) 20 MG tablet Take 20 mg by mouth daily.     tiZANidine (ZANAFLEX) 4 MG  tablet TAKE 1 TABLET(4 MG) BY MOUTH EVERY 8 HOURS AS NEEDED FOR MUSCLE SPASMS 30 tablet 1   No current facility-administered medications for this visit.    REVIEW OF SYSTEMS:  [X]  denotes positive finding, [ ]  denotes negative finding Cardiac  Comments:  Chest pain or chest pressure:    Shortness of breath upon exertion:    Short of breath when lying flat:    Irregular heart rhythm:        Vascular    Pain in calf, thigh, or hip brought on by ambulation: x   Pain in feet at night that wakes you up from your sleep:     Blood clot in your veins:    Leg swelling:         Pulmonary    Oxygen at home:    Productive cough:     Wheezing:         Neurologic    Sudden weakness in arms or legs:     Sudden numbness in arms or legs:     Sudden onset of difficulty speaking or slurred speech:    Temporary loss of vision in one eye:     Problems with dizziness:         Gastrointestinal    Blood in stool:     Vomited blood:         Genitourinary    Burning when urinating:     Blood in urine:        Psychiatric    Major depression:         Hematologic    Bleeding problems:    Problems with blood clotting too easily:        Skin    Rashes or ulcers:        Constitutional    Fever or chills:      PHYSICAL EXAM There were no vitals filed for this visit.   Constitutional: Well appearing. no distress. Appears well nourished.  Neurologic: CN intact. no focal findings. no sensory loss. Psychiatric:  Mood and affect symmetric and appropriate. Eyes:  No icterus. No conjunctival pallor. Ears, nose, throat:  mucous membranes moist. Midline trachea.  Cardiac: regular rate and rhythm.  Respiratory:  unlabored. Abdominal:  soft, non-tender, non-distended.  Peripheral vascular: 1+ R DP. Absent L DP. Extremity: no edema. no cyanosis. no pallor.  Skin: no gangrene. no ulceration.  Lymphatic: no Stemmer's sign. no palpable lymphadenopathy.  PERTINENT LABORATORY AND RADIOLOGIC  DATA   +-------+-----------+-----------+------------+------------+   ABI/TBI Today's ABI Today's TBI Previous ABI Previous TBI   +-------+-----------+-----------+------------+------------+   Right   1.06        0.73        1.05         0.67           +-------+-----------+-----------+------------+------------+   Left    0.65        0.62  0.90         0.58           +-------+-----------+-----------+------------+------------+   Rande Brunt. Lenell Antu, MD [facility] Phone Number: [phone] 03/06/2021 3:34 PM  Total time spent on preparing this encounter including chart review, data review, collecting history, examining the patient, coordinating care for this new patient, 60 minutes.  Portions of this report may have been transcribed using voice recognition software.  Every effort has been made to ensure accuracy; however, inadvertent computerized transcription errors may still be present.

## 2021-03-07 ENCOUNTER — Ambulatory Visit: Payer: Medicare Other | Admitting: Vascular Surgery

## 2021-03-07 ENCOUNTER — Inpatient Hospital Stay (HOSPITAL_COMMUNITY): Admission: RE | Admit: 2021-03-07 | Payer: Medicare Other | Source: Ambulatory Visit

## 2021-04-11 NOTE — Progress Notes (Signed)
? ?I, Wendy Poet, LAT, ATC, am serving as scribe for Dr. Lynne Leader. ? ?Randall Velasquez is a 66 y.o. male who presents to [place] at [facility] today for f/u of chronic LBP due to foraminal narrowing per MRI. He was last seen by Dr. Georgina Snell on 01/09/21 and had a Toradol and depo-medrol IM injection and was prescribed tizanidine.  Today, pt reports the LBP is about the same. Pt is now R lateral hip pain w/ radiating symptoms to the lateral thigh, lower leg, and ankle. ?He denies weakness but notes his symptoms have worsened recently. ? ?Additionally he notes that he no longer has a primary care provider has run out of his chronic medications including pantoprazole, losartan, Pletal, atorvastatin, and aspirin.  He has not taken these medications in months. ? ?Diagnostic testing: L-spine MRI- 11/30/20, L knee and L-spine XR- 09/27/20 ? ?Pertinent review of systems: No fevers or chills ? ?Relevant historical information: PAD, hypertension, GERD, CKD 3. ? ? ?Exam:  ?BP (!) 150/102   Ht 5\' 11"  (1.803 m)   Wt 168 lb (76.2 kg)   BMI 23.43 kg/m?  ?General: Well Developed, well nourished, and in no acute distress.  ? ?MSK: L-spine: Nontender midline. ?Normal lumbar motion. ?Positive right-sided slump test. ?Lower extremity strength is intact. ?Reflexes are intact. ? ? ? ?Lab and Radiology Results ?EXAM: ?MRI LUMBAR SPINE WITHOUT CONTRAST ?  ?TECHNIQUE: ?Multiplanar, multisequence MR imaging of the lumbar spine was ?performed. No intravenous contrast was administered. ?  ?COMPARISON:  No prior MRI, correlation is made with 09/27/2020 ?lumbar spine radiographs. ?  ?FINDINGS: ?Segmentation:  Standard. ?  ?Alignment: S shaped curvature of the thoracolumbar spine. Trace ?retrolisthesis L4 on L5. ?  ?Vertebrae:  No fracture, evidence of discitis, or bone lesion. ?  ?Conus medullaris and cauda equina: Conus extends to the L2 level. ?Conus and cauda equina appear normal. ?  ?Paraspinal and other soft  tissues: The left kidney is not ?visualized. ?  ?Disc levels: ?  ?T12-L1: No significant disc bulge. No spinal canal stenosis or ?neural foraminal narrowing. ?  ?L1-L2: No significant disc bulge. No spinal canal stenosis or neural ?foraminal narrowing. ?  ?L2-L3: Mild disc bulge. Mild facet arthropathy. No spinal canal ?stenosis. No neural foraminal narrowing. ?  ?L3-L4: Broad-based disc bulge. Mild facet arthropathy. No spinal ?canal stenosis. Mild left neural foraminal narrowing. ?  ?L4-L5: Trace retrolisthesis. Broad-based disc bulge. Mild facet ?arthropathy. Narrowing of the bilateral lateral recesses. Mild right ?neural foraminal narrowing. ?  ?L5-S1: Mild disc bulge. Mild facet arthropathy. No spinal canal ?stenosis. Moderate left and mild right neural foraminal narrowing. ?  ?IMPRESSION: ?1. L5-S1 moderate left and mild right neural foraminal narrowing. ?2. L4-L5 mild right neural foraminal narrowing, with narrowing of ?the bilateral lateral recesses, which could affect the descending L5 ?nerves. ?3. L3-L4 mild left neural foraminal narrowing. ?4. No spinal canal stenosis. ?  ?  ?Electronically Signed ?  By: Merilyn Baba M.D. ?  On: 11/30/2020 23:28 ?  ?I, Lynne Leader, personally (independently) visualized and performed the interpretation of the images attached in this note. ? ? ? ? ? ?Assessment and Plan: ?66 y.o. male with right lumbar radiculopathy at L5.  This is an acute exacerbation of a chronic issue and his symptoms have worsened over the last few weeks.  Plan at this time to proceed to the lumbar epidural steroid injection.  Refill tizanidine. ? ?Additionally he is run out of his primary care medications and needs  a new primary care provider.  Provided information of available PCPs. ? ? ?PDMP not reviewed this encounter. ?Orders Placed This Encounter  ?Procedures  ? DG INJECT DIAG/THERA/INC NEEDLE/CATH/PLC EPI/LUMB/SAC W/IMG  ?  R, L5 nerve root ?Level and technique per radiology  ?  Standing Status:    Future  ?  Standing Expiration Date:   05/13/2021  ?  Order Specific Question:   Reason for Exam (SYMPTOM  OR DIAGNOSIS REQUIRED)  ?  Answer:   Low back pain  ?  Order Specific Question:   Preferred Imaging Location?  ?  Answer:   [id number]  ? ?Meds ordered this encounter  ?Medications  ? tiZANidine (ZANAFLEX) 4 MG tablet  ?  Sig: TAKE 1 TABLET(4 MG) BY MOUTH EVERY 8 HOURS AS NEEDED FOR MUSCLE SPASMS  ?  Dispense:  30 tablet  ?  Refill:  1  ? ? ? ?Discussed warning signs or symptoms. Please see discharge instructions. Patient expresses understanding. ? ? ?The above documentation has been reviewed and is accurate and complete Lynne Leader, M.D. ? ? ?

## 2021-04-12 ENCOUNTER — Other Ambulatory Visit: Payer: Self-pay

## 2021-04-12 ENCOUNTER — Ambulatory Visit (INDEPENDENT_AMBULATORY_CARE_PROVIDER_SITE_OTHER): Payer: Medicare Other | Admitting: Family Medicine

## 2021-04-12 VITALS — BP 150/102 | Ht 71.0 in | Wt 168.0 lb

## 2021-04-12 DIAGNOSIS — M5416 Radiculopathy, lumbar region: Secondary | ICD-10-CM | POA: Diagnosis not present

## 2021-04-12 MED ORDER — TIZANIDINE HCL 4 MG PO TABS: ORAL_TABLET | ORAL | 1 refills | Status: DC

## 2021-04-12 NOTE — Patient Instructions (Addendum)
Thank you for coming in today.  ? ?Here is a list of primary care providers. Please get an appointment. ? ?Please call [facility] at [phone] to schedule your spine injection.   ?

## 2021-04-14 ENCOUNTER — Ambulatory Visit (HOSPITAL_COMMUNITY)
Admission: EM | Admit: 2021-04-14 | Discharge: 2021-04-14 | Disposition: A | Discharge status: Home / Self Care | Payer: Medicare Other | Attending: Family Medicine | Admitting: Family Medicine

## 2021-04-14 ENCOUNTER — Other Ambulatory Visit: Payer: Self-pay

## 2021-04-14 ENCOUNTER — Encounter (HOSPITAL_COMMUNITY): Payer: Self-pay

## 2021-04-14 DIAGNOSIS — G8929 Other chronic pain: Secondary | ICD-10-CM

## 2021-04-14 DIAGNOSIS — I10 Essential (primary) hypertension: Secondary | ICD-10-CM | POA: Diagnosis not present

## 2021-04-14 DIAGNOSIS — M5441 Lumbago with sciatica, right side: Secondary | ICD-10-CM

## 2021-04-14 DIAGNOSIS — I739 Peripheral vascular disease, unspecified: Secondary | ICD-10-CM | POA: Diagnosis not present

## 2021-04-14 MED ORDER — KETOROLAC TROMETHAMINE 30 MG/ML IJ SOLN
30.0000 mg | Freq: Once | INTRAMUSCULAR | Status: AC
  Administered 2021-04-14: 30 mg via INTRAMUSCULAR

## 2021-04-14 MED ORDER — KETOROLAC TROMETHAMINE 30 MG/ML IJ SOLN
INTRAMUSCULAR | Status: AC
  Filled 2021-04-14: qty 1

## 2021-04-14 MED ORDER — TRAMADOL HCL 50 MG PO TABS: 50.0000 mg | ORAL_TABLET | Freq: Four times a day (QID) | ORAL | 0 refills | Status: DC | PRN

## 2021-04-14 MED ORDER — CILOSTAZOL 50 MG PO TABS: 50.0000 mg | ORAL_TABLET | Freq: Two times a day (BID) | ORAL | 0 refills | Status: DC

## 2021-04-14 MED ORDER — LOSARTAN POTASSIUM 25 MG PO TABS: 25.0000 mg | ORAL_TABLET | Freq: Every day | ORAL | 0 refills | Status: DC

## 2021-04-14 NOTE — ED Triage Notes (Signed)
Patient presents to Urgent Care with complaints of right hip pain x 2 weeks. He states he was seen at [facility] and they instructed him to schedule an appt for spine injection. He has not scheduled this appt yet. He is also concerned with elevated blood pressure. He states he has been out of BP meds x 1.5 months ago. He states he does not have a pcp. Treating pain with lidocaine patch and muscle relaxer with no relief. Last dose at 0300.  ?

## 2021-04-14 NOTE — ED Provider Notes (Addendum)
?[facility] ? ? ? ?CSN: [id number] ?Arrival date & time: 04/14/21  0940 ? ? ?  ? ?History   ?Chief Complaint ?Chief Complaint  ?Patient presents with  ? Hip Pain  ? Hypertension  ? ? ?HPI ?Randall Velasquez is a 66 y.o. male.  ? ? ?Hip Pain ? ?Hypertension ? ?Here for pain in his right low back radiating into his right leg.  He has been seeing [facility], and is awaiting an appointment for an epidural spine injection.  He states muscle relaxers, Tylenol, and ibuprofen are not helping.  He also states in the past gabapentin and pregabalin did not help.  He has not filled anything on PMP since he filled some pregabalin in August. ? ?He also has a history of hypertension and peripheral vascular disease.  He has been out of his medications for a month or month and a half. ? ?I can see in epic that he has an appointment with Dr Tommi Rumps [place] on March 7.  He is supposed to be taking, among other things, losartan 25 mg daily and cilostazol 50 mg twice daily.  I did confirm those doses with his prior pharmacy, [facility] on [address] ? ?Past Medical History:  ?Diagnosis Date  ? Hyperlipidemia   ? Hypertension   ? ? ?Patient Active Problem List  ? Diagnosis Date Noted  ? CKD (chronic kidney disease) stage 3, GFR 30-59 ml/min (HCC) 05/30/2020  ? Smoker 05/26/2020  ? PAD (peripheral artery disease) (HCC) 05/19/2020  ? Essential hypertension 09/30/2019  ? Gastroesophageal reflux disease 09/30/2019  ? ? ?Past Surgical History:  ?Procedure Laterality Date  ? HERNIA REPAIR    ? as a child  ? ? ? ? ? ?Home Medications   ? ?Prior to Admission medications   ?Medication Sig Start Date End Date Taking? Authorizing Provider  ?traMADol (ULTRAM) 50 MG tablet Take 1 tablet (50 mg total) by mouth every 6 (six) hours as needed. 04/14/21  Yes Zenia Resides, MD  ?aspirin EC 81 MG tablet Take 81 mg by mouth daily. Swallow whole.    [provider]  ?atorvastatin (LIPITOR) 10 MG tablet Take 10 mg by mouth daily.     [provider]  ?cilostazol (PLETAL) 50 MG tablet Take 1 tablet (50 mg total) by mouth 2 (two) times daily. 04/14/21   Zenia Resides, MD  ?losartan (COZAAR) 25 MG tablet Take 1 tablet (25 mg total) by mouth daily. 04/14/21   Zenia Resides, MD  ?pantoprazole (PROTONIX) 20 MG tablet Take 20 mg by mouth daily.    [provider]  ?tiZANidine (ZANAFLEX) 4 MG tablet TAKE 1 TABLET(4 MG) BY MOUTH EVERY 8 HOURS AS NEEDED FOR MUSCLE SPASMS 04/12/21   Rodolph Bong, MD  ? ? ?Family History ?History reviewed. No pertinent family history. ? ?Social History ?Social History  ? ?Tobacco Use  ? Smoking status: Every Day  ?  Packs/day: 2.00  ?  Types: Cigars, Cigarettes  ? Smokeless tobacco: Never  ?Substance Use Topics  ? Alcohol use: Yes  ?  Comment: ever now and again  ? Drug use: Not Currently  ? ? ? ?Allergies   ?Patient has no known allergies. ? ? ?Review of Systems ?Review of Systems ? ? ?Physical Exam ?Triage Vital Signs ?ED Triage Vitals  ?Enc Vitals Group  ?   BP 04/14/21 1123 (!) 174/111  ?   Pulse Rate 04/14/21 1123 63  ?   Resp 04/14/21 1123  16  ?   Temp 04/14/21 1123 98 ?F (36.7 ?C)  ?   Temp Source 04/14/21 1123 Oral  ?   SpO2 04/14/21 1123 98 %  ?   Weight --   ?   Height --   ?   Head Circumference --   ?   Peak Flow --   ?   Pain Score 04/14/21 1125 9  ?   Pain Loc --   ?   Pain Edu? --   ?   Excl. in GC? --   ? ?No data found. ? ?Updated Vital Signs ?BP (!) 174/111 (BP Location: Right Arm)   Pulse 63   Temp 98 ?F (36.7 ?C) (Oral)   Resp 16   SpO2 98%  ? ?Visual Acuity ?Right Eye Distance:   ?Left Eye Distance:   ?Bilateral Distance:   ? ?Right Eye Near:   ?Left Eye Near:    ?Bilateral Near:    ? ?Physical Exam ? ? ?UC Treatments / Results  ?Labs ?(all labs ordered are listed, but only abnormal results are displayed) ?Labs Reviewed - No data to display ? ?EKG ? ? ?Radiology ?No results found. ? ?Procedures ?Procedures (including critical care time) ? ?Medications Ordered in  UC ?Medications  ?ketorolac (TORADOL) 30 MG/ML injection 30 mg (has no administration in time range)  ? ? ?Initial Impression / Assessment and Plan / UC Course  ?I have reviewed the triage vital signs and the nursing notes. ? ?Pertinent labs & imaging results that were available during my care of the patient were reviewed by me and considered in my medical decision making (see chart for details). ? ?  ? ?Discussed that we cannot provide longer term controlled substances for pain relief from the urgent care.  Small quantity prescription of tramadol sent in, and we will provide up shot of Toradol here today.  I did discuss with him and his niece who is on the phone, that this injections effect will only last 6 to 8 hours. ? ? ?Final Clinical Impressions(s) / UC Diagnoses  ? ?Final diagnoses:  ?Chronic right-sided low back pain with right-sided sciatica  ?Essential hypertension, benign  ?Peripheral vascular disease (HCC)  ? ? ? ?Discharge Instructions   ? ?  ?You have been given a shot of Toradol 30 mg here in the office ? ?Take tramadol 50 mg, 1 every 6 hours as needed for pain.  This medication can make you sleepy or dizzy ? ?Prescriptions for your losartan for blood pressure and your cilostazol for your circulation have been sent to the pharmacy. ? ?Keep your appointment for March 7 for your new primary care provider ? ? ?Since you take cilostazol 50 mg circulation, you should not take anti-inflammatory medications like ibuprofen or naproxen much.  The combination can make your stomach bleed. ? ? ? ? ? ? ?ED Prescriptions   ? ? Medication Sig Dispense Auth. Provider  ? cilostazol (PLETAL) 50 MG tablet Take 1 tablet (50 mg total) by mouth 2 (two) times daily. 60 tablet Ryzen Deady, Janace Aris, MD  ? losartan (COZAAR) 25 MG tablet Take 1 tablet (25 mg total) by mouth daily. 30 tablet Shakoya Gilmore, Janace Aris, MD  ? traMADol (ULTRAM) 50 MG tablet Take 1 tablet (50 mg total) by mouth every 6 (six) hours as needed. 10 tablet  Zenia Resides, MD  ? ?  ? ?I have reviewed the PDMP during this encounter. ?  ?Zenia Resides, MD ?04/14/21 1158 ? ?  ?  Zenia Resides, MD ?04/14/21 1159 ? ?  ?Zenia Resides, MD ?04/14/21 1200 ? ?

## 2021-04-14 NOTE — Discharge Instructions (Addendum)
You have been given a shot of Toradol 30 mg here in the office ? ?Take tramadol 50 mg, 1 every 6 hours as needed for pain.  This medication can make you sleepy or dizzy ? ?Prescriptions for your losartan for blood pressure and your cilostazol for your circulation have been sent to the pharmacy. ? ?Keep your appointment for March 7 for your new primary care provider ? ? ?Since you take cilostazol 50 mg circulation, you should not take anti-inflammatory medications like ibuprofen or naproxen much.  The combination can make your stomach bleed. ? ? ?

## 2021-04-18 ENCOUNTER — Other Ambulatory Visit: Payer: Self-pay

## 2021-04-18 ENCOUNTER — Ambulatory Visit (INDEPENDENT_AMBULATORY_CARE_PROVIDER_SITE_OTHER): Payer: Medicare Other | Admitting: Family Medicine

## 2021-04-18 ENCOUNTER — Encounter (HOSPITAL_BASED_OUTPATIENT_CLINIC_OR_DEPARTMENT_OTHER): Payer: Self-pay | Admitting: Family Medicine

## 2021-04-18 VITALS — BP 122/82 | HR 70 | Ht 71.0 in | Wt 167.0 lb

## 2021-04-18 DIAGNOSIS — K219 Gastro-esophageal reflux disease without esophagitis: Secondary | ICD-10-CM | POA: Diagnosis not present

## 2021-04-18 DIAGNOSIS — I10 Essential (primary) hypertension: Secondary | ICD-10-CM | POA: Diagnosis not present

## 2021-04-18 DIAGNOSIS — I739 Peripheral vascular disease, unspecified: Secondary | ICD-10-CM

## 2021-04-18 DIAGNOSIS — Z0000 Encounter for general adult medical examination without abnormal findings: Secondary | ICD-10-CM

## 2021-04-18 MED ORDER — LOSARTAN POTASSIUM 25 MG PO TABS: 25.0000 mg | ORAL_TABLET | Freq: Every day | ORAL | 1 refills | Status: DC

## 2021-04-18 MED ORDER — ATORVASTATIN CALCIUM 10 MG PO TABS: 10.0000 mg | ORAL_TABLET | Freq: Every day | ORAL | 1 refills | Status: DC

## 2021-04-18 MED ORDER — PANTOPRAZOLE SODIUM 20 MG PO TBEC: 20.0000 mg | DELAYED_RELEASE_TABLET | Freq: Every day | ORAL | 1 refills | Status: DC

## 2021-04-18 NOTE — Assessment & Plan Note (Signed)
Symptoms adequately controlled with use of pantoprazole, requesting refill today, refill sent to pharmacy on file. ?

## 2021-04-18 NOTE — Assessment & Plan Note (Addendum)
Blood pressure adequate in office today ?We will refill medications including losartan today ?Recommend intermittent blood pressure monitoring at home, patient is not doing at present ?Recommend DASH diet, Regular aerobic exercise ?

## 2021-04-18 NOTE — Assessment & Plan Note (Signed)
Previously evaluated by vascular surgeon, however has not had follow-up.  Review of chart indicates that follow-up was to be completed 3 months after last appointment which was in October 2022 ?Provided patient with contact information for vascular surgeons office to contact them and schedule follow-up visit ?

## 2021-04-18 NOTE — Patient Instructions (Signed)
? ? ?  Referrals/Procedures/Imaging: ?Please call [facility] @ [phone] ?Please call Dr Blase Mess at [facility] [phone] ? ?Follow-Up: ?Your next appointment:   ?Your physician recommends that you schedule a follow-up appointment in: 6 weeks with Dr. de [place] ? ?You will receive a text message or e-mail with a link to a survey about your care and experience with [place] today! We would greatly appreciate your feedback!  ? ?Thanks for letting us be apart of your health journey!!  ?Primary Care and Sports Medicine  ? ?Dr. Marcy Salvo de [place]  ? ?We encourage you to activate your patient portal called "MyChart".  Sign up information is provided on this After Visit Summary.  MyChart is used to connect with patients for Virtual Visits (Telemedicine).  Patients are able to view lab/test results, encounter notes, upcoming appointments, etc.  Non-urgent messages can be sent to your provider as well. To learn more about what you can do with MyChart, please visit --  [email or web address].   ? ?

## 2021-04-18 NOTE — Progress Notes (Signed)
? ?New Patient Office Visit ? ?Subjective:  ?Patient ID: [id number] Randall Velasquez, male    DOB: 1955-10-23  Age: 66 y.o. MRN: [id number] ? ?CC:  ?Chief Complaint  ?Patient presents with  ? New Patient (Initial Visit)  ?  Patient presents today to establish care. He is concerned about back pain, right leg pain and calf cramps in bilateral legs. He would like a referral down stairs to PT.He needs a refill today on his Protonix.  ? ? ?HPI ?Randall Velasquez is a 66 year old male presenting to establish in clinic.  He has current concerns as outlined above.  Past medical history significant for hyperlipidemia and hypertension.  Indicates that he "never" has had a PCP. ? ?Low back pain: Patient reports history of low back pain which has progressed to include right lower extremity.  Patient has seen Dr. Denyse Amass in the past.  He has also had MRI completed which did show foraminal narrowing at L3-4, L4-5, L5-S1 -most significant was at L5.  Review of chart indicates that plan was for lumbar epidural steroid injection which was ordered by Dr. Denyse Amass.  This was at most recent visit about 1 week ago.  Patient indicates that he has not arranged for epidural steroid injection, reportedly has been waiting to be contacted by imaging center.  Chart review indicates that he was provided with phone number and was instructed to call to schedule ? ?GERD: Patient reports that he has been taking pantoprazole 20 mg daily to help control symptoms, requesting refill of this today. ? ?It also appears that patient has been diagnosed with peripheral arterial disease and was following with vascular surgeon, Dr. Lenell Antu.  It appears last follow-up was in October 2022 with recommendation for following up in 3 months, however patient does not have appointment scheduled and has not seen provider since October.  PAD was found in left lower extremity with intermittent claudication. ? ?Past Medical History:  ?Diagnosis Date  ? Hyperlipidemia   ? Hypertension    ? ? ?Past Surgical History:  ?Procedure Laterality Date  ? HERNIA REPAIR    ? as a child  ? ? ?History reviewed. No pertinent family history. ? ?Social History  ? ?Socioeconomic History  ? Marital status: Single  ?  Spouse name: Not on file  ? Number of children: Not on file  ? Years of education: Not on file  ? Highest education level: Not on file  ?Occupational History  ? Not on file  ?Tobacco Use  ? Smoking status: Every Day  ?  Packs/day: 2.00  ?  Types: Cigars, Cigarettes  ? Smokeless tobacco: Never  ?Substance and Sexual Activity  ? Alcohol use: Yes  ?  Comment: ever now and again  ? Drug use: Not Currently  ? Sexual activity: Not on file  ?Other Topics Concern  ? Not on file  ?Social History Narrative  ? Not on file  ? ?Social Determinants of Health  ? ?Financial Resource Strain: Not on file  ?Food Insecurity: Not on file  ?Transportation Needs: Not on file  ?Physical Activity: Not on file  ?Stress: Not on file  ?Social Connections: Not on file  ?Intimate Partner Violence: Not on file  ? ? ?Objective:  ? ?Today's Vitals: BP 122/82   Pulse 70   Ht 5\' 11"  (1.803 m)   Wt 167 lb (75.8 kg)   SpO2 97%   BMI 23.29 kg/m?  ? ?Physical Exam ? ?66 year old male in no acute distress ?Cardiovascular exam with regular  rate and rhythm, no murmur appreciated ?Lungs clear to auscultation bilaterally ? ?Assessment & Plan:  ? ?Problem List Items Addressed This Visit   ? ?  ? Cardiovascular and Mediastinum  ? Essential hypertension - Primary  ?  Blood pressure adequate in office today ?We will refill medications including losartan today ?Recommend intermittent blood pressure monitoring at home, patient is not doing at present ?Recommend DASH diet, Regular aerobic exercise ?  ?  ? Relevant Medications  ? atorvastatin (LIPITOR) 10 MG tablet  ? losartan (COZAAR) 25 MG tablet  ? Other Relevant Orders  ? CBC with Differential/Platelet  ? PAD (peripheral artery disease) (HCC)  ?  Previously evaluated by vascular surgeon, however  has not had follow-up.  Review of chart indicates that follow-up was to be completed 3 months after last appointment which was in October 2022 ?Provided patient with contact information for vascular surgeons office to contact them and schedule follow-up visit ?  ?  ? Relevant Medications  ? atorvastatin (LIPITOR) 10 MG tablet  ? losartan (COZAAR) 25 MG tablet  ?  ? Digestive  ? Gastroesophageal reflux disease  ?  Symptoms adequately controlled with use of pantoprazole, requesting refill today, refill sent to pharmacy on file. ?  ?  ? Relevant Medications  ? pantoprazole (PROTONIX) 20 MG tablet  ? ?Other Visit Diagnoses   ? ? Wellness examination      ? Relevant Orders  ? CBC with Differential/Platelet  ? Comprehensive metabolic panel  ? Lipid panel  ? TSH Rfx on Abnormal to Free T4  ? ?  ? ? ?Outpatient Encounter Medications as of 04/18/2021  ?Medication Sig  ? aspirin EC 81 MG tablet Take 81 mg by mouth daily. Swallow whole.  ? atorvastatin (LIPITOR) 10 MG tablet Take 1 tablet (10 mg total) by mouth daily.  ? cilostazol (PLETAL) 50 MG tablet Take 1 tablet (50 mg total) by mouth 2 (two) times daily.  ? losartan (COZAAR) 25 MG tablet Take 1 tablet (25 mg total) by mouth daily.  ? pantoprazole (PROTONIX) 20 MG tablet Take 1 tablet (20 mg total) by mouth daily.  ? tiZANidine (ZANAFLEX) 4 MG tablet TAKE 1 TABLET(4 MG) BY MOUTH EVERY 8 HOURS AS NEEDED FOR MUSCLE SPASMS  ? traMADol (ULTRAM) 50 MG tablet Take 1 tablet (50 mg total) by mouth every 6 (six) hours as needed.  ? [DISCONTINUED] atorvastatin (LIPITOR) 10 MG tablet Take 10 mg by mouth daily.  ? [DISCONTINUED] losartan (COZAAR) 25 MG tablet Take 1 tablet (25 mg total) by mouth daily.  ? [DISCONTINUED] pantoprazole (PROTONIX) 20 MG tablet Take 20 mg by mouth daily.  ? ?No facility-administered encounter medications on file as of 04/18/2021.  ? ?Spent 50 minutes on this patient encounter, including preparation, chart review, face-to-face counseling with patient and  coordination of care, and documentation of encounter ? ?Follow-up: Return in about 6 weeks (around 05/30/2021).  Plan for follow-up in about 6 to 8 weeks for CPE, nurse visit for labs 1 week ? ?Harlem Bula J De [place], MD ? ?

## 2021-04-28 ENCOUNTER — Other Ambulatory Visit: Payer: Self-pay

## 2021-04-28 ENCOUNTER — Ambulatory Visit
Admission: RE | Admit: 2021-04-28 | Discharge: 2021-04-28 | Disposition: A | Discharge status: Home / Self Care | Payer: Medicare Other | Source: Ambulatory Visit | Attending: Family Medicine | Admitting: Family Medicine

## 2021-04-28 DIAGNOSIS — M5416 Radiculopathy, lumbar region: Secondary | ICD-10-CM

## 2021-04-28 MED ORDER — IOPAMIDOL (ISOVUE-M 200) INJECTION 41%: 10.0000 mL | Freq: Once | INTRAMUSCULAR | Status: DC

## 2021-04-28 NOTE — Discharge Instr - Other Orders (Signed)
pts BP is elevated. Pt reports he did not take any of his medication this AM, including BP medication. Dr. Mosetta Putt is aware and agreed to d/c the patient. Pt aware to go straight home and take his BP medication. Advised pt that he does not need to hold BP medication for future injections. Pt verbalized understanding and has no complaints at this time. He states "I feel fine".  ?

## 2021-04-28 NOTE — Discharge Instructions (Signed)

## 2021-05-23 ENCOUNTER — Ambulatory Visit (HOSPITAL_BASED_OUTPATIENT_CLINIC_OR_DEPARTMENT_OTHER): Payer: Medicare Other

## 2021-05-24 ENCOUNTER — Telehealth (HOSPITAL_BASED_OUTPATIENT_CLINIC_OR_DEPARTMENT_OTHER): Payer: Self-pay | Admitting: Family Medicine

## 2021-05-24 NOTE — Telephone Encounter (Signed)
Pt called and is wanting labs sent to [facility] on [facility] [address] [facility] to get his labs done there. Pt would like a call to see if that could be possible by CMA. ?Please advise. ?

## 2021-05-25 ENCOUNTER — Other Ambulatory Visit (HOSPITAL_BASED_OUTPATIENT_CLINIC_OR_DEPARTMENT_OTHER): Payer: Self-pay

## 2021-05-25 DIAGNOSIS — I10 Essential (primary) hypertension: Secondary | ICD-10-CM

## 2021-05-25 DIAGNOSIS — Z0000 Encounter for general adult medical examination without abnormal findings: Secondary | ICD-10-CM

## 2021-05-25 NOTE — Telephone Encounter (Signed)
Lab requisition has been printed and given to [place] at the front to fax to requested location. AS, CMA ?

## 2021-05-30 ENCOUNTER — Encounter (HOSPITAL_BASED_OUTPATIENT_CLINIC_OR_DEPARTMENT_OTHER): Payer: Self-pay | Admitting: Family Medicine

## 2021-05-30 ENCOUNTER — Ambulatory Visit (INDEPENDENT_AMBULATORY_CARE_PROVIDER_SITE_OTHER): Payer: Medicare Other | Admitting: Family Medicine

## 2021-05-30 VITALS — BP 111/72 | HR 90 | Temp 97.7°F | Ht 71.0 in | Wt 162.5 lb

## 2021-05-30 DIAGNOSIS — Z0000 Encounter for general adult medical examination without abnormal findings: Secondary | ICD-10-CM | POA: Insufficient documentation

## 2021-05-30 DIAGNOSIS — Z23 Encounter for immunization: Secondary | ICD-10-CM

## 2021-05-30 DIAGNOSIS — Z122 Encounter for screening for malignant neoplasm of respiratory organs: Secondary | ICD-10-CM

## 2021-05-30 DIAGNOSIS — Z87891 Personal history of nicotine dependence: Secondary | ICD-10-CM

## 2021-05-30 DIAGNOSIS — Z1211 Encounter for screening for malignant neoplasm of colon: Secondary | ICD-10-CM

## 2021-05-30 MED ORDER — SHINGRIX 50 MCG/0.5ML IM SUSR: 0.5000 mL | Freq: Once | INTRAMUSCULAR | 0 refills | Status: AC

## 2021-05-30 NOTE — Assessment & Plan Note (Signed)
Routine HCM labs ordered. HCM reviewed/discussed. Anticipatory guidance regarding healthy weight, lifestyle and choices given. ?Recommend healthy diet.  Recommend approximately 150 minutes/week of moderate intensity exercise ?Recommend regular dental and vision exams ?Always use seatbelt/lap and shoulder restraints ?Recommend using smoke alarms and checking batteries at least twice a year ?Recommend using sunscreen when outside ?Discussed colon cancer screening recommendations, options - patient would like to proceed with Cologuard, order placed ?Discussed recommendations for shingles vaccine.  Patient amenable, Rx sent to pharmacy ?Discussed tetanus immunization recommendations, patient UTD ?History of smoking - cigarettes for at least 20 years, 1ppd, stopped about 20 years ago, still smokes cigars about 2-3 per day - order placed for CT scan for lung cancer screening ?Patient due for pneumococcal vaccination, amenable to this, will complete with upcoming [place] with labs ?

## 2021-05-30 NOTE — Patient Instructions (Signed)
Preventive Care 65 Years and Older, Male Preventive care refers to lifestyle choices and visits with your health care provider that can promote health and wellness. Preventive care visits are also called wellness exams. What can I expect for my preventive care visit? Counseling During your preventive care visit, your health care provider may ask about your: Medical history, including: Past medical problems. Family medical history. History of falls. Current health, including: Emotional well-being. Home life and relationship well-being. Sexual activity. Memory and ability to understand (cognition). Lifestyle, including: Alcohol, nicotine or tobacco, and drug use. Access to firearms. Diet, exercise, and sleep habits. Work and work environment. Sunscreen use. Safety issues such as seatbelt and bike helmet use. Physical exam Your health care provider will check your: Height and weight. These may be used to calculate your BMI (body mass index). BMI is a measurement that tells if you are at a healthy weight. Waist circumference. This measures the distance around your waistline. This measurement also tells if you are at a healthy weight and may help predict your risk of certain diseases, such as type 2 diabetes and high blood pressure. Heart rate and blood pressure. Body temperature. Skin for abnormal spots. What immunizations do I need?  Vaccines are usually given at various ages, according to a schedule. Your health care provider will recommend vaccines for you based on your age, medical history, and lifestyle or other factors, such as travel or where you work. What tests do I need? Screening Your health care provider may recommend screening tests for certain conditions. This may include: Lipid and cholesterol levels. Diabetes screening. This is done by checking your blood sugar (glucose) after you have not eaten for a while (fasting). Hepatitis C test. Hepatitis B test. HIV (human  immunodeficiency virus) test. STI (sexually transmitted infection) testing, if you are at risk. Lung cancer screening. Colorectal cancer screening. Prostate cancer screening. Abdominal aortic aneurysm (AAA) screening. You may need this if you are a current or former smoker. Talk with your health care provider about your test results, treatment options, and if necessary, the need for more tests. Follow these instructions at home: Eating and drinking  Eat a diet that includes fresh fruits and vegetables, whole grains, lean protein, and low-fat dairy products. Limit your intake of foods with high amounts of sugar, saturated fats, and salt. Take vitamin and mineral supplements as recommended by your health care provider. Do not drink alcohol if your health care provider tells you not to drink. If you drink alcohol: Limit how much you have to 0-2 drinks a day. Know how much alcohol is in your drink. In the [place] one drink equals one 12 oz bottle of beer (355 mL), one 5 oz glass of wine (148 mL), or one 1 oz glass of hard liquor (44 mL). Lifestyle Brush your teeth every morning and night with fluoride toothpaste. Floss one time each day. Exercise for at least 30 minutes 5 or more days each week. Do not use any products that contain nicotine or tobacco. These products include cigarettes, chewing tobacco, and vaping devices, such as e-cigarettes. If you need help quitting, ask your health care provider. Do not use drugs. If you are sexually active, practice safe sex. Use a condom or other form of protection to prevent STIs. Take aspirin only as told by your health care provider. Make sure that you understand how much to take and what form to take. Work with your health care provider to find out whether it is safe   and beneficial for you to take aspirin daily. Ask your health care provider if you need to take a cholesterol-lowering medicine (statin). Find healthy ways to manage stress, such  as: Meditation, yoga, or listening to music. Journaling. Talking to a trusted person. Spending time with friends and family. Safety Always wear your seat belt while driving or riding in a vehicle. Do not drive: If you have been drinking alcohol. Do not ride with someone who has been drinking. When you are tired or distracted. While texting. If you have been using any mind-altering substances or drugs. Wear a helmet and other protective equipment during sports activities. If you have firearms in your house, make sure you follow all gun safety procedures. Minimize exposure to UV radiation to reduce your risk of skin cancer. What's next? Visit your health care provider once a year for an annual wellness visit. Ask your health care provider how often you should have your eyes and teeth checked. Stay up to date on all vaccines. This information is not intended to replace advice given to you by your health care provider. Make sure you discuss any questions you have with your health care provider. Document Revised: 07/27/2020 Document Reviewed: 07/27/2020 Elsevier Patient Education  2023 Elsevier Inc[employer]  

## 2021-05-30 NOTE — Progress Notes (Signed)
?Subjective:   ? ?CC: Annual Physical Exam ? ?HPI:  ?Randall Velasquez is a 66 y.o. presenting for annual physical ? ?I reviewed the past medical history, family history, social history, surgical history, and allergies today and no changes were needed.  Please see the problem list section below in epic for further details. ? ?Past Medical History: ?Past Medical History:  ?Diagnosis Date  ? Hyperlipidemia   ? Hypertension   ? ?Past Surgical History: ?Past Surgical History:  ?Procedure Laterality Date  ? HERNIA REPAIR    ? as a child  ? ?Social History: ?Social History  ? ?Socioeconomic History  ? Marital status: Single  ?  Spouse name: Not on file  ? Number of children: Not on file  ? Years of education: Not on file  ? Highest education level: Not on file  ?Occupational History  ? Not on file  ?Tobacco Use  ? Smoking status: Every Day  ?  Packs/day: 2.00  ?  Types: Cigars, Cigarettes  ? Smokeless tobacco: Never  ?Substance and Sexual Activity  ? Alcohol use: Yes  ?  Comment: ever now and again  ? Drug use: Not Currently  ? Sexual activity: Not on file  ?Other Topics Concern  ? Not on file  ?Social History Narrative  ? Not on file  ? ?Social Determinants of Health  ? ?Financial Resource Strain: Not on file  ?Food Insecurity: Not on file  ?Transportation Needs: Not on file  ?Physical Activity: Not on file  ?Stress: Not on file  ?Social Connections: Not on file  ? ?Family History: ?No family history on file. ?Allergies: ?No Known Allergies ?Medications: See med rec. ? ?Review of Systems: No headache, visual changes, nausea, vomiting, diarrhea, constipation, dizziness, abdominal pain, skin rash, fevers, chills, night sweats, swollen lymph nodes, weight loss, chest pain, body aches, joint swelling, muscle aches, shortness of breath, mood changes, visual or auditory hallucinations. ? ?Objective:   ? ?BP 111/72   Pulse 90   Temp 97.7 ?F (36.5 ?C)   Ht 5\' 11"  (1.803 m)   Wt 162 lb 8 oz (73.7 kg)   SpO2 94%   BMI  22.66 kg/m?  ? ?General: Well Developed, well nourished, and in no acute distress.  ?Neuro: Alert and oriented x3, extra-ocular muscles intact, sensation grossly intact. Cranial nerves II through XII are intact, motor, sensory, and coordinative functions are all intact. ?HEENT: Normocephalic, atraumatic, pupils equal round reactive to light, neck supple, no masses, no lymphadenopathy, thyroid nonpalpable. Oropharynx, nasopharynx, external ear canals are unremarkable. ?Skin: Warm and dry, no rashes noted.  ?Cardiac: Regular rate and rhythm, no murmurs rubs or gallops.  ?Respiratory: Clear to auscultation bilaterally. Not using accessory muscles, speaking in full sentences.  ?Abdominal: Soft, nontender, nondistended, positive bowel sounds, no masses, no organomegaly.  ?Musculoskeletal: Shoulder, elbow, wrist, hip, knee, ankle stable, and with full range of motion. ? ?Impression and Recommendations:   ? ?Wellness examination ?Routine HCM labs ordered. HCM reviewed/discussed. Anticipatory guidance regarding healthy weight, lifestyle and choices given. ?Recommend healthy diet.  Recommend approximately 150 minutes/week of moderate intensity exercise ?Recommend regular dental and vision exams ?Always use seatbelt/lap and shoulder restraints ?Recommend using smoke alarms and checking batteries at least twice a year ?Recommend using sunscreen when outside ?Discussed colon cancer screening recommendations, options - patient would like to proceed with Cologuard, order placed ?Discussed recommendations for shingles vaccine.  Patient amenable, Rx sent to pharmacy ?Discussed tetanus immunization recommendations, patient UTD ?History of smoking - cigarettes for  at least 20 years, 1ppd, stopped about 20 years ago, still smokes cigars about 2-3 per day - order placed for CT scan for lung cancer screening ?Patient due for pneumococcal vaccination, amenable to this, will complete with upcoming NV with labs ? ?Patient due for  pneumococcal vaccination, discussed this in office today, he is open to this, will plan to complete this at time of nurse visit when patient returns to also have labs completed, this will likely be in 1 to 2 weeks ? ? ?___________________________________________ ?Sade Mehlhoff de [place], MD, ABFM, CAQSM ?Primary Care and Sports Medicine ?[place] ?

## 2021-06-06 ENCOUNTER — Ambulatory Visit (HOSPITAL_BASED_OUTPATIENT_CLINIC_OR_DEPARTMENT_OTHER): Payer: Medicare Other

## 2021-06-07 ENCOUNTER — Encounter (HOSPITAL_BASED_OUTPATIENT_CLINIC_OR_DEPARTMENT_OTHER): Payer: Self-pay | Admitting: Family Medicine

## 2021-06-07 ENCOUNTER — Ambulatory Visit (INDEPENDENT_AMBULATORY_CARE_PROVIDER_SITE_OTHER): Payer: Medicare Other | Admitting: Family Medicine

## 2021-06-07 VITALS — BP 138/80 | HR 74 | Temp 97.7°F | Ht 71.0 in | Wt 167.1 lb

## 2021-06-07 DIAGNOSIS — R21 Rash and other nonspecific skin eruption: Secondary | ICD-10-CM | POA: Diagnosis not present

## 2021-06-07 MED ORDER — MOMETASONE FUROATE 0.1 % EX CREA: TOPICAL_CREAM | Freq: Every day | CUTANEOUS | 1 refills | Status: DC

## 2021-06-07 NOTE — Progress Notes (Signed)
    Procedures performed today:    None.  Independent interpretation of notes and tests performed by another provider:   None.  Brief History, Exam, Impression, and Recommendations:    BP 138/80   Pulse 74   Temp 97.7 F (36.5 C) (Temporal)   Ht 5\' 11"  (1.803 m)   Wt 167 lb 1.6 oz (75.8 kg)   SpO2 94%   BMI 23.31 kg/m   Rash Patient presents for evaluation of rash which he has noticed in recent weeks.  Rash has become gradually more noticeable.  Associated some mild itching.  Affected areas generally are on the trunk, some involvement of extremities as well.  He has some patches of affected skin without diffuse rash over the body.  Denies any significant pain or tenderness with rash.  No reported drainage, discharge.  Denies any systemic symptoms such as fever, chills, sweats. On exam, patient does have patches of erythematous skin changes with somewhat dry/rough surface.  No weeping or drainage appreciated.  Patient is in no acute distress on exam, vital signs stable. Uncertain etiology for rash, appears to be almost eczematous in nature, however unusual areas of involvement for this.  Additional consideration could be related to medications. We will proceed initially with topical steroid cream and monitor response.  We will also place referral to dermatology for patient to arrange appointment for further evaluation should rash not improve  We have ordered labs previously for patient to complete and is fasting labs at nurse visit.  Unfortunately he has missed those appointments thus far.  Will have patient schedule a another time to come by to complete labs in the near future   ___________________________________________ Colum Colt de , MD, ABFM, CAQSM Primary Care and Sports Medicine [facility]

## 2021-06-12 NOTE — Progress Notes (Signed)
? ?I, Christoper Fabian, LAT, ATC, am serving as scribe for Dr. [name]. ? ?Randall Velasquez is a 66 y.o. 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  He was last seen by Dr. Denyse Amass on 04/12/21 and was referred for a lumbar ESI that he had on 04/28/21 (R L5 nerve root block and transforaminal ESI).  He was also prescribed Tizanidine.  Today, pt reports prior ESI only gave him 2 weeks of relief and pain started returning. Bilat LE numbness/tingling, R>L.  ? ?Pt notes that he has broken out in a rash over multiple areas of his body and unsure if this is related to his rx.  He has atorvastatin, losartan, pantoprazole, and cilostazol on his medicine list.  He notes in the past he has taken atorvastatin, losartan, and pantoprazole for years without any problems.  The cilostazol is a relatively new ideation.  He thinks the rash started when he started the cilostazol but he also restarted the other 3 medicines about the same time so is hard for him to tell.  He has been treating the rash with a topical steroid cream prescribed by his primary care provider.  Denies runny nose cough or congestion. ? ?Diagnostic testing: L-spine MRI- 11/30/20, L knee and L-spine XR- 09/27/20 ? ?Pertinent review of systems: No fevers or chills or systemic symptoms. ? ?Relevant historical information: Peripheral arterial disease.  Hypertension. ? ? ?Exam:  ?BP (!) 184/104   Pulse 75   Ht 5\' 11"  (1.803 m)   Wt 164 lb 12.8 oz (74.8 kg)   SpO2 96%   BMI 22.98 kg/m?  ?General: Well Developed, well nourished, and in no acute distress.  ? ?MSK: Normal lumbar motion.  Lower extremity strength is intact. ? ?Skin: Erythematous maculopapular rash on trunk and lower extremities. ? ? ? ?Lab and Radiology Results ? ? ?EXAM: ?MRI LUMBAR SPINE WITHOUT CONTRAST ?  ?TECHNIQUE: ?Multiplanar, multisequence MR imaging of the lumbar spine was ?performed. No intravenous contrast  was administered. ?  ?COMPARISON:  No prior MRI, correlation is made with 09/27/2020 ?lumbar spine radiographs. ?  ?FINDINGS: ?Segmentation:  Standard. ?  ?Alignment: S shaped curvature of the thoracolumbar spine. Trace ?retrolisthesis L4 on L5. ?  ?Vertebrae:  No fracture, evidence of discitis, or bone lesion. ?  ?Conus medullaris and cauda equina: Conus extends to the L2 level. ?Conus and cauda equina appear normal. ?  ?Paraspinal and other soft tissues: The left kidney is not ?visualized. ?  ?Disc levels: ?  ?T12-L1: No significant disc bulge. No spinal canal stenosis or ?neural foraminal narrowing. ?  ?L1-L2: No significant disc bulge. No spinal canal stenosis or neural ?foraminal narrowing. ?  ?L2-L3: Mild disc bulge. Mild facet arthropathy. No spinal canal ?stenosis. No neural foraminal narrowing. ?  ?L3-L4: Broad-based disc bulge. Mild facet arthropathy. No spinal ?canal stenosis. Mild left neural foraminal narrowing. ?  ?L4-L5: Trace retrolisthesis. Broad-based disc bulge. Mild facet ?arthropathy. Narrowing of the bilateral lateral recesses. Mild right ?neural foraminal narrowing. ?  ?L5-S1: Mild disc bulge. Mild facet arthropathy. No spinal canal ?stenosis. Moderate left and mild right neural foraminal narrowing. ?  ?IMPRESSION: ?1. L5-S1 moderate left and mild right neural foraminal narrowing. ?2. L4-L5 mild right neural foraminal narrowing, with narrowing of ?the bilateral lateral recesses, which could affect the descending L5 ?nerves. ?3. L3-L4 mild left neural foraminal narrowing. ?4. No spinal canal stenosis. ?  ?  ?Electronically  Signed ?  By: Wiliam Ke M.D. ?  On: 11/30/2020 23:28 ?  ?I, [name], personally (independently) visualized and performed the interpretation of the images attached in this note. ? ? ? ? ?Assessment and Plan: ?66 y.o. male with  ?Right lumbar radiculopathy at L5.  Patient had benefit but very short-lived benefit of a nerve root block and transforaminal injection at L5 in  March.  Plan for repeat epidural steroid injection.  Recommend asking the radiologist to consider different technique this time.  It may last longer perhaps. ? ?His rash is a bit of a mystery.  It looks to be more likely to be a drug rash than anything else.  Looking over his medicine list please see listing as well as the most recent addition to him and probably is the most likely cause.  Recommend stopping Pletal and seeing how he does over the next 2 weeks.  If this does not help he is ready to stop all of his medicines.  If he does stop all of his medicines he should see how his rash responds and then restart his medications one by 1 and see if he can identify the most likely cause.  Recommend that he contact his primary care provider if he does this as well.  Continue topical steroid cream for now. ? ? ?PDMP not reviewed this encounter. ?Orders Placed This Encounter  ?Procedures  ? DG INJECT DIAG/THERA/INC NEEDLE/CATH/PLC EPI/LUMB/SAC W/IMG  ?  Level and technique per radiology. Consider different technique and first ESI was ineffective.  ?  Standing Status:   Future  ?  Standing Expiration Date:   07/14/2021  ?  Order Specific Question:   Reason for Exam (SYMPTOM  OR DIAGNOSIS REQUIRED)  ?  Answer:   Low back pain  ?  Order Specific Question:   Preferred Imaging Location?  ?  Answer:   [id number]  ? ?No orders of the defined types were placed in this encounter. ? ? ? ?Discussed warning signs or symptoms. Please see discharge instructions. Patient expresses understanding. ? ? ?The above documentation has been reviewed and is accurate and complete [name], M.D. ? ? ?

## 2021-06-13 ENCOUNTER — Ambulatory Visit (INDEPENDENT_AMBULATORY_CARE_PROVIDER_SITE_OTHER): Payer: Medicare Other | Admitting: Family Medicine

## 2021-06-13 VITALS — BP 184/104 | HR 75 | Ht 71.0 in | Wt 164.8 lb

## 2021-06-13 DIAGNOSIS — L270 Generalized skin eruption due to drugs and medicaments taken internally: Secondary | ICD-10-CM | POA: Diagnosis not present

## 2021-06-13 DIAGNOSIS — M5416 Radiculopathy, lumbar region: Secondary | ICD-10-CM | POA: Diagnosis not present

## 2021-06-13 NOTE — Patient Instructions (Addendum)
Thank you for coming in today.  ? ?Please call [facility] at [phone] to schedule your spine injection.   ? ?Stop cilostazol and communicate this with your primary care provider ? ?Recheck back as needed ? ? ?

## 2021-06-15 ENCOUNTER — Ambulatory Visit (HOSPITAL_BASED_OUTPATIENT_CLINIC_OR_DEPARTMENT_OTHER): Payer: Medicare Other

## 2021-06-22 ENCOUNTER — Ambulatory Visit
Admission: RE | Admit: 2021-06-22 | Discharge: 2021-06-22 | Disposition: A | Discharge status: Home / Self Care | Payer: Medicare Other | Source: Ambulatory Visit | Attending: Family Medicine | Admitting: Family Medicine

## 2021-06-22 DIAGNOSIS — M5416 Radiculopathy, lumbar region: Secondary | ICD-10-CM

## 2021-06-22 MED ORDER — IOPAMIDOL (ISOVUE-M 200) INJECTION 41%: 1.0000 mL | Freq: Once | INTRAMUSCULAR | Status: DC

## 2021-06-22 MED ORDER — METHYLPREDNISOLONE ACETATE 40 MG/ML INJ SUSP (RADIOLOG: 80.0000 mg | Freq: Once | INTRAMUSCULAR | Status: DC

## 2021-06-22 NOTE — Discharge Instructions (Signed)

## 2021-06-29 ENCOUNTER — Ambulatory Visit
Admission: RE | Admit: 2021-06-29 | Discharge: 2021-06-29 | Disposition: A | Discharge status: Home / Self Care | Payer: Medicare Other | Source: Ambulatory Visit | Attending: Family Medicine | Admitting: Family Medicine

## 2021-06-29 ENCOUNTER — Encounter: Payer: Self-pay | Admitting: Emergency Medicine

## 2021-06-29 ENCOUNTER — Ambulatory Visit (INDEPENDENT_AMBULATORY_CARE_PROVIDER_SITE_OTHER): Payer: Medicare Other | Admitting: Emergency Medicine

## 2021-06-29 ENCOUNTER — Other Ambulatory Visit: Payer: Self-pay | Admitting: Emergency Medicine

## 2021-06-29 ENCOUNTER — Encounter: Payer: Self-pay | Admitting: Gastroenterology

## 2021-06-29 VITALS — BP 182/110 | HR 80 | Temp 98.5°F | Ht 71.0 in | Wt 163.4 lb

## 2021-06-29 DIAGNOSIS — K219 Gastro-esophageal reflux disease without esophagitis: Secondary | ICD-10-CM

## 2021-06-29 DIAGNOSIS — Z8719 Personal history of other diseases of the digestive system: Secondary | ICD-10-CM

## 2021-06-29 DIAGNOSIS — E785 Hyperlipidemia, unspecified: Secondary | ICD-10-CM

## 2021-06-29 DIAGNOSIS — R21 Rash and other nonspecific skin eruption: Secondary | ICD-10-CM | POA: Insufficient documentation

## 2021-06-29 DIAGNOSIS — Z122 Encounter for screening for malignant neoplasm of respiratory organs: Secondary | ICD-10-CM

## 2021-06-29 DIAGNOSIS — I10 Essential (primary) hypertension: Secondary | ICD-10-CM | POA: Diagnosis not present

## 2021-06-29 DIAGNOSIS — Z87891 Personal history of nicotine dependence: Secondary | ICD-10-CM

## 2021-06-29 DIAGNOSIS — Z23 Encounter for immunization: Secondary | ICD-10-CM | POA: Diagnosis not present

## 2021-06-29 DIAGNOSIS — Z7689 Persons encountering health services in other specified circumstances: Secondary | ICD-10-CM | POA: Diagnosis not present

## 2021-06-29 DIAGNOSIS — F17200 Nicotine dependence, unspecified, uncomplicated: Secondary | ICD-10-CM

## 2021-06-29 DIAGNOSIS — Z1211 Encounter for screening for malignant neoplasm of colon: Secondary | ICD-10-CM

## 2021-06-29 LAB — COMPREHENSIVE METABOLIC PANEL
ALT: 15 U/L (ref 0–53)
AST: 16 U/L (ref 0–37)
Albumin: 4 g/dL (ref 3.5–5.2)
Alkaline Phosphatase: 80 U/L (ref 39–117)
BUN: 20 mg/dL (ref 6–23)
CO2: 29 mEq/L (ref 19–32)
Calcium: 9.4 mg/dL (ref 8.4–10.5)
Chloride: 97 mEq/L (ref 96–112)
Creatinine, Ser: 1.01 mg/dL (ref 0.40–1.50)
GFR: 77.95 mL/min (ref >60.00)
Glucose, Bld: 54 mg/dL — ABNORMAL LOW (ref 70–99)
Potassium: 4.1 mEq/L (ref 3.5–5.1)
Sodium: 127 mEq/L — ABNORMAL LOW (ref 135–145)
Total Bilirubin: 0.7 mg/dL (ref 0.2–1.2)
Total Protein: 6.7 g/dL (ref 6.0–8.3)

## 2021-06-29 LAB — LIPID PANEL
Cholesterol: 116 mg/dL (ref 0–200)
HDL: 60.9 mg/dL (ref >39.00)
LDL Cholesterol: 47 mg/dL (ref 0–99)
NonHDL: 55.22
Total CHOL/HDL Ratio: 2
Triglycerides: 40 mg/dL (ref 0.0–149.0)
VLDL: 8 mg/dL (ref 0.0–40.0)

## 2021-06-29 LAB — CBC WITH DIFFERENTIAL/PLATELET
Basophils Absolute: 0 10*3/uL (ref 0.0–0.1)
Basophils Relative: 0.7 % (ref 0.0–3.0)
Eosinophils Absolute: 0.2 10*3/uL (ref 0.0–0.7)
Eosinophils Relative: 2.2 % (ref 0.0–5.0)
HCT: 37.8 % — ABNORMAL LOW (ref 39.0–52.0)
Hemoglobin: 13.4 g/dL (ref 13.0–17.0)
Lymphocytes Relative: 37.6 % (ref 12.0–46.0)
Lymphs Abs: 2.7 10*3/uL (ref 0.7–4.0)
MCHC: 35.6 g/dL (ref 30.0–36.0)
MCV: 92.3 fl (ref 78.0–100.0)
Monocytes Absolute: 0.5 10*3/uL (ref 0.1–1.0)
Monocytes Relative: 7.7 % (ref 3.0–12.0)
Neutro Abs: 3.7 10*3/uL (ref 1.4–7.7)
Neutrophils Relative %: 51.8 % (ref 43.0–77.0)
Platelets: 124 10*3/uL — ABNORMAL LOW (ref 150.0–400.0)
RBC: 4.09 Mil/uL — ABNORMAL LOW (ref 4.22–5.81)
RDW: 16.1 % — ABNORMAL HIGH (ref 11.5–15.5)
WBC: 7.1 10*3/uL (ref 4.0–10.5)

## 2021-06-29 MED ORDER — ROSUVASTATIN CALCIUM 20 MG PO TABS: 20.0000 mg | ORAL_TABLET | Freq: Every day | ORAL | 3 refills | Status: DC

## 2021-06-29 MED ORDER — VALSARTAN-HYDROCHLOROTHIAZIDE 160-12.5 MG PO TABS: 1.0000 | ORAL_TABLET | Freq: Every day | ORAL | 3 refills | Status: DC

## 2021-06-29 NOTE — Assessment & Plan Note (Signed)
Cardiovascular and cancer risk associated with smoking discussed. Smoking cessation advice given. 

## 2021-06-29 NOTE — Patient Instructions (Signed)
Hypertension, Adult High blood pressure (hypertension) is when the force of blood pumping through the arteries is too strong. The arteries are the blood vessels that carry blood from the heart throughout the body. Hypertension forces the heart to work harder to pump blood and may cause arteries to become narrow or stiff. Untreated or uncontrolled hypertension can lead to a heart attack, heart failure, a stroke, kidney disease, and other problems. A blood pressure reading consists of a higher number over a lower number. Ideally, your blood pressure should be below 120/80. The first ("top") number is called the systolic pressure. It is a measure of the pressure in your arteries as your heart beats. The second ("bottom") number is called the diastolic pressure. It is a measure of the pressure in your arteries as the heart relaxes. What are the causes? The exact cause of this condition is not known. There are some conditions that result in high blood pressure. What increases the risk? Certain factors may make you more likely to develop high blood pressure. Some of these risk factors are under your control, including: Smoking. Not getting enough exercise or physical activity. Being overweight. Having too much fat, sugar, calories, or salt (sodium) in your diet. Drinking too much alcohol. Other risk factors include: Having a personal history of heart disease, diabetes, high cholesterol, or kidney disease. Stress. Having a family history of high blood pressure and high cholesterol. Having obstructive sleep apnea. Age. The risk increases with age. What are the signs or symptoms? High blood pressure may not cause symptoms. Very high blood pressure (hypertensive crisis) may cause: Headache. Fast or irregular heartbeats (palpitations). Shortness of breath. Nosebleed. Nausea and vomiting. Vision changes. Severe chest pain, dizziness, and seizures. How is this diagnosed? This condition is diagnosed by  measuring your blood pressure while you are seated, with your arm resting on a flat surface, your legs uncrossed, and your feet flat on the floor. The cuff of the blood pressure monitor will be placed directly against the skin of your upper arm at the level of your heart. Blood pressure should be measured at least twice using the same arm. Certain conditions can cause a difference in blood pressure between your right and left arms. If you have a high blood pressure reading during one visit or you have normal blood pressure with other risk factors, you may be asked to: Return on a different day to have your blood pressure checked again. Monitor your blood pressure at home for 1 week or longer. If you are diagnosed with hypertension, you may have other blood or imaging tests to help your health care provider understand your overall risk for other conditions. How is this treated? This condition is treated by making healthy lifestyle changes, such as eating healthy foods, exercising more, and reducing your alcohol intake. You may be referred for counseling on a healthy diet and physical activity. Your health care provider may prescribe medicine if lifestyle changes are not enough to get your blood pressure under control and if: Your systolic blood pressure is above 130. Your diastolic blood pressure is above 80. Your personal target blood pressure may vary depending on your medical conditions, your age, and other factors. Follow these instructions at home: Eating and drinking  Eat a diet that is high in fiber and potassium, and low in sodium, added sugar, and fat. An example of this eating plan is called the DASH diet. DASH stands for Dietary Approaches to Stop Hypertension. To eat this way: Eat   plenty of fresh fruits and vegetables. Try to fill one half of your plate at each meal with fruits and vegetables. Eat whole grains, such as whole-wheat pasta, brown rice, or whole-grain bread. Fill about one  fourth of your plate with whole grains. Eat or drink low-fat dairy products, such as skim milk or low-fat yogurt. Avoid fatty cuts of meat, processed or cured meats, and poultry with skin. Fill about one fourth of your plate with lean proteins, such as fish, chicken without skin, beans, eggs, or tofu. Avoid pre-made and processed foods. These tend to be higher in sodium, added sugar, and fat. Reduce your daily sodium intake. Many people with hypertension should eat less than 1,500 mg of sodium a day. Do not drink alcohol if: Your health care provider tells you not to drink. You are pregnant, may be pregnant, or are planning to become pregnant. If you drink alcohol: Limit how much you have to: 0-1 drink a day for women. 0-2 drinks a day for men. Know how much alcohol is in your drink. In the [place] one drink equals one 12 oz bottle of beer (355 mL), one 5 oz glass of wine (148 mL), or one 1 oz glass of hard liquor (44 mL). Lifestyle  Work with your health care provider to maintain a healthy body weight or to lose weight. Ask what an ideal weight is for you. Get at least 30 minutes of exercise that causes your heart to beat faster (aerobic exercise) most days of the week. Activities may include walking, swimming, or biking. Include exercise to strengthen your muscles (resistance exercise), such as Pilates or lifting weights, as part of your weekly exercise routine. Try to do these types of exercises for 30 minutes at least 3 days a week. Do not use any products that contain nicotine or tobacco. These products include cigarettes, chewing tobacco, and vaping devices, such as e-cigarettes. If you need help quitting, ask your health care provider. Monitor your blood pressure at home as told by your health care provider. Keep all follow-up visits. This is important. Medicines Take over-the-counter and prescription medicines only as told by your health care provider. Follow directions carefully. Blood  pressure medicines must be taken as prescribed. Do not skip doses of blood pressure medicine. Doing this puts you at risk for problems and can make the medicine less effective. Ask your health care provider about side effects or reactions to medicines that you should watch for. Contact a health care provider if you: Think you are having a reaction to a medicine you are taking. Have headaches that keep coming back (recurring). Feel dizzy. Have swelling in your ankles. Have trouble with your vision. Get help right away if you: Develop a severe headache or confusion. Have unusual weakness or numbness. Feel faint. Have severe pain in your chest or abdomen. Vomit repeatedly. Have trouble breathing. These symptoms may be an emergency. Get help right away. Call 911. Do not wait to see if the symptoms will go away. Do not drive yourself to the hospital. Summary Hypertension is when the force of blood pumping through your arteries is too strong. If this condition is not controlled, it may put you at risk for serious complications. Your personal target blood pressure may vary depending on your medical conditions, your age, and other factors. For most people, a normal blood pressure is less than 120/80. Hypertension is treated with lifestyle changes, medicines, or a combination of both. Lifestyle changes include losing weight, eating a healthy,   low-sodium diet, exercising more, and limiting alcohol. This information is not intended to replace advice given to you by your health care provider. Make sure you discuss any questions you have with your health care provider. Document Revised: 12/06/2020 Document Reviewed: 12/06/2020 Elsevier Patient Education  2023 Else[employer] Inc.  

## 2021-06-29 NOTE — Progress Notes (Signed)
Randall Velasquez 66 y.o.   Chief Complaint  Patient presents with   New Patient (Initial Visit)    HISTORY OF PRESENT ILLNESS: This is a 66 y.o. male first visit with me, here to establish care. Chronic smoker with history of hypertension on losartan 25 mg daily. Also history of dyslipidemia on atorvastatin 10 mg daily History of GERD on pantoprazole 20 mg. Recently developed a rash to cilostazol.  No longer taking it. No other complaints or medical concerns today.  HPI   Prior to Admission medications   Medication Sig Start Date End Date Taking? Authorizing Provider  atorvastatin (LIPITOR) 10 MG tablet Take 1 tablet (10 mg total) by mouth daily. 04/18/21  Yes de [place], Raymond J, MD  cilostazol (PLETAL) 50 MG tablet Take 1 tablet (50 mg total) by mouth 2 (two) times daily. 04/14/21  Yes Zenia Resides, MD  losartan (COZAAR) 25 MG tablet Take 1 tablet (25 mg total) by mouth daily. 04/18/21  Yes de [place], Raymond J, MD  mometasone (ELOCON) 0.1 % cream Apply topically daily. 06/07/21  Yes de [place], Raymond J, MD  pantoprazole (PROTONIX) 20 MG tablet Take 1 tablet (20 mg total) by mouth daily. 04/18/21  Yes de [place], Raymond J, MD    No Known Allergies  Patient Active Problem List   Diagnosis Date Noted   Rash 06/29/2021   Wellness examination 05/30/2021   CKD (chronic kidney disease) stage 3, GFR 30-59 ml/min (HCC) 05/30/2020   Smoker 05/26/2020   Chronic left-sided thoracic back pain 05/26/2020   PAD (peripheral artery disease) (HCC) 05/19/2020   Essential hypertension 09/30/2019   Gastroesophageal reflux disease 09/30/2019   Subcutaneous mass 09/30/2019    Past Medical History:  Diagnosis Date   Hyperlipidemia    Hypertension     Past Surgical History:  Procedure Laterality Date   HERNIA REPAIR     as a child    Social History   Socioeconomic History   Marital status: Single    Spouse name: Not on file   Number of children: Not on file   Years of education: Not  on file   Highest education level: Not on file  Occupational History   Not on file  Tobacco Use   Smoking status: Every Day    Packs/day: 2.00    Types: Cigars, Cigarettes   Smokeless tobacco: Never  Substance and Sexual Activity   Alcohol use: Yes    Comment: ever now and again   Drug use: Not Currently   Sexual activity: Not on file  Other Topics Concern   Not on file  Social History Narrative   Not on file   Social Determinants of Health   Financial Resource Strain: Not on file  Food Insecurity: Not on file  Transportation Needs: Not on file  Physical Activity: Not on file  Stress: Not on file  Social Connections: Not on file  Intimate Partner Violence: Not on file    No family history on file.   Review of Systems  Constitutional: Negative.  Negative for chills and fever.  HENT: Negative.    Eyes: Negative.   Respiratory: Negative.    Cardiovascular: Negative.  Negative for chest pain and palpitations.  Gastrointestinal:  Positive for heartburn. Negative for abdominal pain, nausea and vomiting.  Musculoskeletal: Negative.   Skin: Negative.   Neurological:  Negative for dizziness and headaches.  All other systems reviewed and are negative.  Today's Vitals   06/29/21 0855 06/29/21 0901  BP: Marland Kitchen)  180/110 (!) 182/110  Pulse: 80   Temp: 98.5 F (36.9 C)   TempSrc: Oral   SpO2: 96%   Weight: 163 lb 6 oz (74.1 kg)   Height:  (1.803 m)    Body mass index is 22.79 kg/m.  Physical Exam Vitals reviewed.  Constitutional:      Appearance: Normal appearance.  HENT:     Head: Normocephalic.     Mouth/Throat:     Mouth: Mucous membranes are moist.     Pharynx: Oropharynx is clear.  Eyes:     Extraocular Movements: Extraocular movements intact.     Conjunctiva/sclera: Conjunctivae normal.     Pupils: Pupils are equal, round, and reactive to light.  Cardiovascular:     Rate and Rhythm: Normal rate and regular rhythm.     Pulses: Normal pulses.      Heart sounds: Normal heart sounds.  Pulmonary:     Effort: Pulmonary effort is normal.     Breath sounds: Normal breath sounds.  Abdominal:     General: There is no distension.     Palpations: Abdomen is soft.     Tenderness: There is no abdominal tenderness.  Musculoskeletal:     Cervical back: No tenderness.     Right lower leg: No edema.     Left lower leg: No edema.  Lymphadenopathy:     Cervical: No cervical adenopathy.  Skin:    General: Skin is warm and dry.     Capillary Refill: Capillary refill takes less than 2 seconds.  Neurological:     General: No focal deficit present.     Mental Status: He is alert and oriented to person, place, and time.  Psychiatric:        Mood and Affect: Mood normal.        Behavior: Behavior normal.     ASSESSMENT & PLAN: A total of 50 minutes was spent with the patient and counseling/coordination of care regarding preparing for this visit, establishing care with me, review of available medical records, hypertension diagnosis and management, cardiovascular risks associated with uncontrolled hypertension, education on nutrition, cardiovascular and cancer risk associated with smoking and smoking cessation advised, need for blood work today, review of health maintenance items including need for colon cancer screening and colonoscopy, prognosis, documentation and need for follow-up.  Problem List Items Addressed This Visit       Cardiovascular and Mediastinum   Essential hypertension    Uncontrolled hypertension.  Stop losartan and start valsartan-HCTZ 160-12.5 mg daily. Dietary approaches to stop hypertension discussed. Cardiovascular risks associated with uncontrolled hypertension discussed. Advised to monitor blood pressure at home daily for the next several weeks and keep a log. Follow-up in 3 months.       Relevant Medications   valsartan-hydrochlorothiazide (DIOVAN-HCT) 160-12.5 MG tablet   rosuvastatin (CRESTOR) 20 MG tablet      Digestive   Gastroesophageal reflux disease    Stable and controlled on pantoprazole 20 mg daily.         Other   Current smoker    Cardiovascular and cancer risk associated with smoking discussed. Smoking cessation advice given.       History of gastroesophageal reflux (GERD)   Dyslipidemia    Stable.  Lipid profile done today. Stop atorvastatin and start rosuvastatin 20 mg daily. Diet and nutrition discussed.        Relevant Medications   rosuvastatin (CRESTOR) 20 MG tablet   Other Relevant Orders   Lipid panel  Other Visit Diagnoses     Uncontrolled hypertension    -  Primary   Relevant Medications   valsartan-hydrochlorothiazide (DIOVAN-HCT) 160-12.5 MG tablet   rosuvastatin (CRESTOR) 20 MG tablet   Other Relevant Orders   CBC with Differential/Platelet   Comprehensive metabolic panel   Need for vaccination       Relevant Orders   Pneumococcal conjugate vaccine 20-valent (Prevnar 20) (Completed)   Encounter to establish care       Colon cancer screening       Relevant Orders   Ambulatory referral to Gastroenterology      Patient Instructions  Hypertension, Adult High blood pressure (hypertension) is when the force of blood pumping through the arteries is too strong. The arteries are the blood vessels that carry blood from the heart throughout the body. Hypertension forces the heart to work harder to pump blood and may cause arteries to become narrow or stiff. Untreated or uncontrolled hypertension can lead to a heart attack, heart failure, a stroke, kidney disease, and other problems. A blood pressure reading consists of a higher number over a lower number. Ideally, your blood pressure should be below 120/80. The first ("top") number is called the systolic pressure. It is a measure of the pressure in your arteries as your heart beats. The second ("bottom") number is called the diastolic pressure. It is a measure of the pressure in your arteries as the heart  relaxes. What are the causes? The exact cause of this condition is not known. There are some conditions that result in high blood pressure. What increases the risk? Certain factors may make you more likely to develop high blood pressure. Some of these risk factors are under your control, including: Smoking. Not getting enough exercise or physical activity. Being overweight. Having too much fat, sugar, calories, or salt (sodium) in your diet. Drinking too much alcohol. Other risk factors include: Having a personal history of heart disease, diabetes, high cholesterol, or kidney disease. Stress. Having a family history of high blood pressure and high cholesterol. Having obstructive sleep apnea. Age. The risk increases with age. What are the signs or symptoms? High blood pressure may not cause symptoms. Very high blood pressure (hypertensive crisis) may cause: Headache. Fast or irregular heartbeats (palpitations). Shortness of breath. Nosebleed. Nausea and vomiting. Vision changes. Severe chest pain, dizziness, and seizures. How is this diagnosed? This condition is diagnosed by measuring your blood pressure while you are seated, with your arm resting on a flat surface, your legs uncrossed, and your feet flat on the floor. The cuff of the blood pressure monitor will be placed directly against the skin of your upper arm at the level of your heart. Blood pressure should be measured at least twice using the same arm. Certain conditions can cause a difference in blood pressure between your right and left arms. If you have a high blood pressure reading during one visit or you have normal blood pressure with other risk factors, you may be asked to: Return on a different day to have your blood pressure checked again. Monitor your blood pressure at home for 1 week or longer. If you are diagnosed with hypertension, you may have other blood or imaging tests to help your health care provider understand  your overall risk for other conditions. How is this treated? This condition is treated by making healthy lifestyle changes, such as eating healthy foods, exercising more, and reducing your alcohol intake. You may be referred for counseling on a healthy  diet and physical activity. Your health care provider may prescribe medicine if lifestyle changes are not enough to get your blood pressure under control and if: Your systolic blood pressure is above 130. Your diastolic blood pressure is above 80. Your personal target blood pressure may vary depending on your medical conditions, your age, and other factors. Follow these instructions at home: Eating and drinking  Eat a diet that is high in fiber and potassium, and low in sodium, added sugar, and fat. An example of this eating plan is called the DASH diet. DASH stands for Dietary Approaches to Stop Hypertension. To eat this way: Eat plenty of fresh fruits and vegetables. Try to fill one half of your plate at each meal with fruits and vegetables. Eat whole grains, such as whole-wheat pasta, brown rice, or whole-grain bread. Fill about one fourth of your plate with whole grains. Eat or drink low-fat dairy products, such as skim milk or low-fat yogurt. Avoid fatty cuts of meat, processed or cured meats, and poultry with skin. Fill about one fourth of your plate with lean proteins, such as fish, chicken without skin, beans, eggs, or tofu. Avoid pre-made and processed foods. These tend to be higher in sodium, added sugar, and fat. Reduce your daily sodium intake. Many people with hypertension should eat less than 1,500 mg of sodium a day. Do not drink alcohol if: Your health care provider tells you not to drink. You are pregnant, may be pregnant, or are planning to become pregnant. If you drink alcohol: Limit how much you have to: 0-1 drink a day for women. 0-2 drinks a day for men. Know how much alcohol is in your drink. In the [place] one drink equals  one 12 oz bottle of beer (355 mL), one 5 oz glass of wine (148 mL), or one 1 oz glass of hard liquor (44 mL). Lifestyle  Work with your health care provider to maintain a healthy body weight or to lose weight. Ask what an ideal weight is for you. Get at least 30 minutes of exercise that causes your heart to beat faster (aerobic exercise) most days of the week. Activities may include walking, swimming, or biking. Include exercise to strengthen your muscles (resistance exercise), such as Pilates or lifting weights, as part of your weekly exercise routine. Try to do these types of exercises for 30 minutes at least 3 days a week. Do not use any products that contain nicotine or tobacco. These products include cigarettes, chewing tobacco, and vaping devices, such as e-cigarettes. If you need help quitting, ask your health care provider. Monitor your blood pressure at home as told by your health care provider. Keep all follow-up visits. This is important. Medicines Take over-the-counter and prescription medicines only as told by your health care provider. Follow directions carefully. Blood pressure medicines must be taken as prescribed. Do not skip doses of blood pressure medicine. Doing this puts you at risk for problems and can make the medicine less effective. Ask your health care provider about side effects or reactions to medicines that you should watch for. Contact a health care provider if you: Think you are having a reaction to a medicine you are taking. Have headaches that keep coming back (recurring). Feel dizzy. Have swelling in your ankles. Have trouble with your vision. Get help right away if you: Develop a severe headache or confusion. Have unusual weakness or numbness. Feel faint. Have severe pain in your chest or abdomen. Vomit repeatedly. Have trouble breathing. These  symptoms may be an emergency. Get help right away. Call 911. Do not wait to see if the symptoms will go  away. Do not drive yourself to the hospital. Summary Hypertension is when the force of blood pumping through your arteries is too strong. If this condition is not controlled, it may put you at risk for serious complications. Your personal target blood pressure may vary depending on your medical conditions, your age, and other factors. For most people, a normal blood pressure is less than 120/80. Hypertension is treated with lifestyle changes, medicines, or a combination of both. Lifestyle changes include losing weight, eating a healthy, low-sodium diet, exercising more, and limiting alcohol. This information is not intended to replace advice given to you by your health care provider. Make sure you discuss any questions you have with your health care provider. Document Revised: 12/06/2020 Document Reviewed: 12/06/2020 Elsevier Patient Education  2023 Elsevier Inc.    Edwina Barth, MD [facility]

## 2021-06-29 NOTE — Assessment & Plan Note (Signed)
Uncontrolled hypertension.  Stop losartan and start valsartan-HCTZ 160-12.5 mg daily. Dietary approaches to stop hypertension discussed. Cardiovascular risks associated with uncontrolled hypertension discussed. Advised to monitor blood pressure at home daily for the next several weeks and keep a log. Follow-up in 3 months.

## 2021-06-29 NOTE — Assessment & Plan Note (Signed)
Patient presents for evaluation of rash which he has noticed in recent weeks.  Rash has become gradually more noticeable.  Associated some mild itching.  Affected areas generally are on the trunk, some involvement of extremities as well.  He has some patches of affected skin without diffuse rash over the body.  Denies any significant pain or tenderness with rash.  No reported drainage, discharge.  Denies any systemic symptoms such as fever, chills, sweats. On exam, patient does have patches of erythematous skin changes with somewhat dry/rough surface.  No weeping or drainage appreciated.  Patient is in no acute distress on exam, vital signs stable. Uncertain etiology for rash, appears to be almost eczematous in nature, however unusual areas of involvement for this.  Additional consideration could be related to medications. We will proceed initially with topical steroid cream and monitor response.  We will also place referral to dermatology for patient to arrange appointment for further evaluation should rash not improve

## 2021-06-29 NOTE — Assessment & Plan Note (Signed)
Stable.  Lipid profile done today. Stop atorvastatin and start rosuvastatin 20 mg daily. Diet and nutrition discussed.

## 2021-06-29 NOTE — Assessment & Plan Note (Signed)
Stable and controlled on pantoprazole 20 mg daily.

## 2021-06-30 ENCOUNTER — Telehealth: Payer: Self-pay | Admitting: *Deleted

## 2021-06-30 NOTE — Telephone Encounter (Signed)
Called patient niece on DPR to relay lab results. She is wanting a referral to cardiology due to family hx of cardiovascular issues. Please advise if referral is ok

## 2021-07-01 ENCOUNTER — Other Ambulatory Visit: Payer: Self-pay | Admitting: Emergency Medicine

## 2021-07-01 DIAGNOSIS — E785 Hyperlipidemia, unspecified: Secondary | ICD-10-CM

## 2021-07-01 DIAGNOSIS — I10 Essential (primary) hypertension: Secondary | ICD-10-CM

## 2021-07-01 DIAGNOSIS — F17200 Nicotine dependence, unspecified, uncomplicated: Secondary | ICD-10-CM

## 2021-07-01 DIAGNOSIS — Z8249 Family history of ischemic heart disease and other diseases of the circulatory system: Secondary | ICD-10-CM

## 2021-07-01 NOTE — Telephone Encounter (Signed)
Agree.  Cardiology referral placed today.  Thanks.

## 2021-07-03 ENCOUNTER — Other Ambulatory Visit (INDEPENDENT_AMBULATORY_CARE_PROVIDER_SITE_OTHER): Payer: Medicare Other

## 2021-07-03 ENCOUNTER — Other Ambulatory Visit: Payer: Medicare Other

## 2021-07-03 DIAGNOSIS — I10 Essential (primary) hypertension: Secondary | ICD-10-CM

## 2021-07-03 LAB — BASIC METABOLIC PANEL
BUN: 23 mg/dL (ref 6–23)
CO2: 28 mEq/L (ref 19–32)
Calcium: 9.1 mg/dL (ref 8.4–10.5)
Chloride: 105 mEq/L (ref 96–112)
Creatinine, Ser: 1.52 mg/dL — ABNORMAL HIGH (ref 0.40–1.50)
GFR: 47.73 mL/min — ABNORMAL LOW (ref >60.00)
Glucose, Bld: 105 mg/dL — ABNORMAL HIGH (ref 70–99)
Potassium: 3.7 mEq/L (ref 3.5–5.1)
Sodium: 139 mEq/L (ref 135–145)

## 2021-07-04 ENCOUNTER — Encounter: Payer: Self-pay | Admitting: *Deleted

## 2021-07-04 ENCOUNTER — Telehealth: Payer: Self-pay | Admitting: Emergency Medicine

## 2021-07-04 NOTE — Telephone Encounter (Signed)
1.Medication Requested:  2. Pharmacy (Name, Street, City):  3. On Med List:   4. Last Visit with PCP:   5. Next visit date with PCP:   Agent: Please be advised that RX refills may take up to 3 business days. We ask that you follow-up with your pharmacy.  

## 2021-07-07 ENCOUNTER — Ambulatory Visit (HOSPITAL_BASED_OUTPATIENT_CLINIC_OR_DEPARTMENT_OTHER): Payer: Medicare Other

## 2021-07-11 ENCOUNTER — Ambulatory Visit (INDEPENDENT_AMBULATORY_CARE_PROVIDER_SITE_OTHER): Payer: Medicare Other | Admitting: Internal Medicine

## 2021-07-11 ENCOUNTER — Encounter: Payer: Self-pay | Admitting: Internal Medicine

## 2021-07-11 DIAGNOSIS — E785 Hyperlipidemia, unspecified: Secondary | ICD-10-CM | POA: Diagnosis not present

## 2021-07-11 MED ORDER — ROSUVASTATIN CALCIUM 20 MG PO TABS: 20.0000 mg | ORAL_TABLET | Freq: Every day | ORAL | 3 refills | Status: DC

## 2021-07-11 MED ORDER — ASPIRIN 81 MG PO TBEC: 81.0000 mg | DELAYED_RELEASE_TABLET | Freq: Every day | ORAL | 3 refills | Status: DC

## 2021-07-11 MED ORDER — CILOSTAZOL 50 MG PO TABS: 50.0000 mg | ORAL_TABLET | Freq: Two times a day (BID) | ORAL | 3 refills | Status: DC

## 2021-07-11 NOTE — Progress Notes (Signed)
Cardiology Office Note:    Date:  07/11/2021   ID:  KEVAL NAM, DOB Jun 21, 1955, MRN [id number]  PCP:  Georgina Quint, MD   [facility] Providers Cardiologist:  Maisie Fus, MD     Referring MD: Georgina Quint, *   No chief complaint on file. CVD risk factors  History of Present Illness:    Randall Velasquez is a 66 y.o. male with a hx of poorly controlled HTN, HLD, former smoker, PAD referral for management  He notes pain in both legs. He has cramping. This occurs with walking. He is active. He is not smoking cigarettes. He notes mild cigar smoking. Former persistent cigarette smoker 20-30 years ago. He denies dyspnea on exertion or chest pressure. No family hx of premature CAD. HTN is much better controlled. He was not taking any statins although crestor was on his list. No hx of myalgias.  Cardiology Studies: US arterial duplex: mild left lower ext. Peripheral artery disease (ABI=0.9), monophasic waveforms  ABI 12/02/2020 - moderate left lower ext disease, left toe brachial index abnormal.   Past Medical History:  Diagnosis Date   Hyperlipidemia    Hypertension     Past Surgical History:  Procedure Laterality Date   HERNIA REPAIR     as a child    Current Medications: Current Meds  Medication Sig   aspirin EC 81 MG tablet Take 1 tablet (81 mg total) by mouth daily. Swallow whole.   cilostazol (PLETAL) 50 MG tablet Take 1 tablet (50 mg total) by mouth 2 (two) times daily.   mometasone (ELOCON) 0.1 % cream Apply topically daily.   pantoprazole (PROTONIX) 20 MG tablet Take 1 tablet (20 mg total) by mouth daily.   valsartan-hydrochlorothiazide (DIOVAN-HCT) 160-12.5 MG tablet Take 1 tablet by mouth daily.     Allergies:   Patient has no known allergies.   Social History   Socioeconomic History   Marital status: Single    Spouse name: Not on file   Number of children: Not on file   Years of education: Not on file   Highest education  level: Not on file  Occupational History   Not on file  Tobacco Use   Smoking status: Every Day    Packs/day: 2.00    Types: Cigars, Cigarettes   Smokeless tobacco: Never  Substance and Sexual Activity   Alcohol use: Yes    Comment: ever now and again   Drug use: Not Currently   Sexual activity: Not on file  Other Topics Concern   Not on file  Social History Narrative   Not on file   Social Determinants of Health   Financial Resource Strain: Not on file  Food Insecurity: Not on file  Transportation Needs: Not on file  Physical Activity: Not on file  Stress: Not on file  Social Connections: Not on file     Family History: Grandmother had DMII, MI Sister had MI in her 1s.  ROS:   Please see the history of present illness.     All other systems reviewed and are negative.  EKGs/Labs/Other Studies Reviewed:    The following studies were reviewed today:   EKG:  EKG is  ordered today.  The ekg ordered today demonstrates   NSR  Recent Labs: 06/29/2021: ALT 15; Hemoglobin 13.4; Platelets 124.0 07/03/2021: BUN 23; Creatinine, Ser 1.52; Potassium 3.7; Sodium 139   Recent Lipid Panel    Component Value Date/Time   CHOL 116 06/29/2021 0936  TRIG 40.0 06/29/2021 0936   HDL 60.90 06/29/2021 0936   CHOLHDL 2 06/29/2021 0936   VLDL 8.0 06/29/2021 0936   LDLCALC 47 06/29/2021 0936     Risk Assessment/Calculations:           Physical Exam:    VS:    Vitals:   07/11/21 1355  BP: 110/68  Pulse: 65  SpO2: 98%     Wt Readings from Last 3 Encounters:  07/11/21 157 lb 6.4 oz (71.4 kg)  06/29/21 163 lb 6 oz (74.1 kg)  06/13/21 164 lb 12.8 oz (74.8 kg)     GEN:  Well nourished, well developed in no acute distress HEENT: Normal NECK: No JVD; No carotid bruits LYMPHATICS: No lymphadenopathy CARDIAC: RRR, no murmurs, rubs, gallops RESPIRATORY:  Clear to auscultation without rales, wheezing or rhonchi  ABDOMEN: Soft, non-tender,  non-distended MUSCULOSKELETAL:  No edema; No deformity  SKIN: Warm and dry NEUROLOGIC:  Alert and oriented x 3 PSYCHIATRIC:  Normal affect   ASSESSMENT:    PAD: Encouraged to continue off cigarette smoking and refraining from any smoking.  Started aspirin, continue crestor 20 mg daily. Add cilostazole. We discussed the importance of  exercise. No signs of ALI.  HLD: LDL goal at least < 70 mg/dL. LDL 47 mg/dL  HTN:  prior poorly controlled blood pressure. 180s/110. Now much better, 110/68 mm Hg. Continue valsartan-HCTz . Stopped clorthalidone in the past due to rash.  PLAN:    In order of problems listed above:  Start aspirin Start cilostazol 50 mg BID Follow up in 6 months      Medication Adjustments/Labs and Tests Ordered: Current medicines are reviewed at length with the patient today.  Concerns regarding medicines are outlined above.  Orders Placed This Encounter  Procedures   EKG 12-Lead   Meds ordered this encounter  Medications   rosuvastatin (CRESTOR) 20 MG tablet    Sig: Take 1 tablet (20 mg total) by mouth daily.    Dispense:  90 tablet    Refill:  3   cilostazol (PLETAL) 50 MG tablet    Sig: Take 1 tablet (50 mg total) by mouth 2 (two) times daily.    Dispense:  60 tablet    Refill:  3   aspirin EC 81 MG tablet    Sig: Take 1 tablet (81 mg total) by mouth daily. Swallow whole.    Dispense:  90 tablet    Refill:  3    Patient Instructions  Medication Instructions:   PLEASE TAKE: ASPIRIN 81mg  ONCE DAILY   PLEASE TAKE: CRESTOR (ROSUVASTATIN) 20mg  ONCE DAILY   PLEASE TAKE: CILOSTAZOL 50mg  TWICE DAILY   *If you need a refill on your cardiac medications before your next appointment, please call your pharmacy*  Lab Work: None Ordered At This Time.  If you have labs (blood work) drawn today and your tests are completely normal, you will receive your results only by: MyChart Message (if you have MyChart) OR A paper copy in the mail If you have any lab  test that is abnormal or we need to change your treatment, we will call you to review the results.  Testing/Procedures: None Ordered At This Time.   Follow-Up: At [facility], you and your health needs are our priority.  As part of our continuing mission to provide you with exceptional heart care, we have created designated Provider Care Teams.  These Care Teams include your primary Cardiologist (physician) and Advanced Practice Providers (APPs -  Physician  Assistants and Nurse Practitioners) who all work together to provide you with the care you need, when you need it.  Your next appointment:   6 month(s)  The format for your next appointment:   In Person  Provider:   Maisie Fus, MD            Signed, Maisie Fus, MD  07/11/2021 5:57 PM    [facility]

## 2021-07-11 NOTE — Patient Instructions (Signed)
Medication Instructions:   PLEASE TAKE: ASPIRIN 81mg  ONCE DAILY   PLEASE TAKE: CRESTOR (ROSUVASTATIN) 20mg  ONCE DAILY   PLEASE TAKE: CILOSTAZOL 50mg  TWICE DAILY   *If you need a refill on your cardiac medications before your next appointment, please call your pharmacy*  Lab Work: None Ordered At This Time.  If you have labs (blood work) drawn today and your tests are completely normal, you will receive your results only by: [place] (if you have MyChart) OR A paper copy in the mail If you have any lab test that is abnormal or we need to change your treatment, we will call you to review the results.  Testing/Procedures: None Ordered At This Time.   Follow-Up: At [facility], you and your health needs are our priority.  As part of our continuing mission to provide you with exceptional heart care, we have created designated Provider Care Teams.  These Care Teams include your primary Cardiologist (physician) and Advanced Practice Providers (APPs -  Physician Assistants and Nurse Practitioners) who all work together to provide you with the care you need, when you need it.  Your next appointment:   6 month(s)  The format for your next appointment:   In Person  Provider:   Janina Mayo, MD

## 2021-07-13 ENCOUNTER — Ambulatory Visit (AMBULATORY_SURGERY_CENTER): Payer: Self-pay | Admitting: *Deleted

## 2021-07-13 ENCOUNTER — Telehealth: Payer: Self-pay | Admitting: *Deleted

## 2021-07-13 VITALS — Ht 71.0 in | Wt 160.0 lb

## 2021-07-13 DIAGNOSIS — Z1211 Encounter for screening for malignant neoplasm of colon: Secondary | ICD-10-CM

## 2021-07-13 NOTE — Progress Notes (Signed)
No egg or soy allergy known to patient  No issues known to pt with past sedation with any surgeries or procedures Patient denies ever being told they had issues or difficulty with intubation  No FH of Malignant Hyperthermia Pt is not on diet pills Pt is not on  home 02  Pt is on Pletal for blood thinning, he did not realize he was taking a blood thinner. Started 07/11/21, colonoscopy cancelled and OV scheduled. Pletal was confirmed to be ok for procedure, called pt and asked him to return call. OV cancelled, will need to reschedule colon and give patient prep instructions.  Pt denies issues with constipation  No A fib or A flutter   PV completed in person. Pt verified name, DOB, address and insurance during PV today.

## 2021-07-13 NOTE — Telephone Encounter (Signed)
Left message for pt to call back regarding upcoming appt. Had colon scheduled but was started on a blood thinner on 5/30. Cancelled colon and scheduled an OV. Pt is on Pletal which is a blood thinner that does not require a hold so now need to reschedule colon. PV was completed up to prep instructions, can do instructions with pt. Over the phone when he is available. OV cancelled, will await return call from pt to schedule new colon appt.

## 2021-07-26 NOTE — Telephone Encounter (Signed)
Called patient, no answer. Left a message for the patient to call us back. 

## 2021-07-31 ENCOUNTER — Other Ambulatory Visit (HOSPITAL_BASED_OUTPATIENT_CLINIC_OR_DEPARTMENT_OTHER): Payer: Self-pay | Admitting: Family Medicine

## 2021-07-31 DIAGNOSIS — R21 Rash and other nonspecific skin eruption: Secondary | ICD-10-CM

## 2021-08-01 ENCOUNTER — Encounter: Payer: Self-pay | Admitting: *Deleted

## 2021-08-01 NOTE — Telephone Encounter (Signed)
Attempted to reach pt by phone. Left voicemail message for pt to call and reschedule colonoscopy. Will send letter in MyChart and in mail to pt.

## 2021-08-03 ENCOUNTER — Encounter: Payer: Medicare Other | Admitting: Gastroenterology

## 2021-08-10 ENCOUNTER — Ambulatory Visit: Payer: Medicare Other | Admitting: Physician Assistant

## 2021-08-21 ENCOUNTER — Other Ambulatory Visit: Payer: Self-pay

## 2021-08-21 DIAGNOSIS — E7800 Pure hypercholesterolemia, unspecified: Secondary | ICD-10-CM

## 2021-08-22 ENCOUNTER — Encounter: Payer: Medicare Other | Admitting: Gastroenterology

## 2021-08-22 ENCOUNTER — Telehealth: Payer: Self-pay | Admitting: Gastroenterology

## 2021-08-22 NOTE — Telephone Encounter (Signed)
Patient showed up - not prepped --he does not remember anyone telling him he needed to drink-- also his care partner could only wait 30 minutes--he has no family or friends that would stay with him. R/S him for another pre-visit and procedure in August.

## 2021-08-30 ENCOUNTER — Telehealth: Payer: Self-pay | Admitting: *Deleted

## 2021-08-30 NOTE — Chronic Care Management (AMB) (Signed)
  Care Management   Outreach Note  08/30/2021 Name: Randall Velasquez MRN: [id number] DOB: Jul 08, 1955  Referred by: Georgina Quint, MD Reason for referral : Care Coordination (Initial outreach to schedule referral with [facility] and [facility] [address] )   An unsuccessful telephone outreach was attempted today. The patient was referred to the case management team for assistance with care management and care coordination.   Follow Up Plan:  The care management team will reach out to the patient again over the next 7 days.  If patient returns call to provider office, please advise to call Embedded Care Management Care Guide Misty Stanley* at [phone].Misty Stanley [facility]  Care Coordination Care Guide  Direct Dial: [phone]

## 2021-09-12 NOTE — Chronic Care Management (AMB) (Addendum)
  Care Coordination   Note   09/12/2021 Name: Randall Velasquez MRN: [id number] DOB: Nov 08, 1955  Randall Velasquez is a 66 y.o. year old male who sees Sagardia, Eilleen Kempf, MD for primary care. I reached out to [name] by phone today to offer care coordination services.  [name] was given information about Care Coordination services today including:   The Care Coordination services include support from the care team which includes your Nurse Coordinator, Clinical Social Worker, or Pharmacist.  The Care Coordination team is here to help remove barriers to the health concerns and goals most important to you. Care Coordination services are voluntary, and the patient may decline or stop services at any time by request to their care team member.   Care Coordination Consent Status: Patient niece [name]  agreed to services and verbal consent obtained.   Follow up plan:  Telephone appointment with care coordination team member scheduled for:  [facility] and Licensed Clinical Social Worker on 8/3/23r  Encounter Outcome:  Pt. Scheduled  Gwenevere Ghazi  Care Coordination Care Guide  Direct Dial: [phone]

## 2021-09-14 ENCOUNTER — Ambulatory Visit: Payer: Self-pay | Admitting: Licensed Clinical Social Worker

## 2021-09-14 NOTE — Patient Outreach (Signed)
  Care Coordination  Initial Visit Note   09/14/2021 Name: Randall Velasquez MRN: [id number] DOB: [birth year]  Randall Velasquez is a 66 y.o. year old male who sees Sagardia, Eilleen Kempf, MD for primary care. I spoke with  Randall Velasquez by phone today  What matters to the patients health and wellness today?    Patient was accompanied by his niece Randall Velasquez who provided information during this encounter. Patient and caregiver does not want to complete the appointment today would like to be called back the end of Aug . Information shared with RN as they also requested to cancel that appointment    Goals Addressed   None    SDOH assessments and interventions completed:  No   Care Coordination Interventions Activated:  No  Care Coordination Interventions:  No, not indicated   Follow up plan:  No follow up schedule LCSW will route chart to Care guide to contact patient and niece the end of Aug.    Encounter Outcome:  Pt. Request to Call Back   Sammuel Hines, [facility] Care Coordination  [facility] [phone]

## 2021-09-14 NOTE — Patient Instructions (Signed)
Visit Information  Thank you for taking time to visit with me today. Please don't hesitate to contact me if I can be of assistance to you.   No goals were discussed today:   Goals Addressed   None    Per your request The Care Guide will contact you to reschedule the phone appointment the end of Aug.   Sammuel Hines, LCSW Care Coordination  [facility] [phone]   Please call the care guide team at [phone] if you need to cancel or reschedule your appointment.

## 2021-09-21 ENCOUNTER — Ambulatory Visit (AMBULATORY_SURGERY_CENTER): Payer: Medicare Other

## 2021-09-21 VITALS — Ht 71.0 in | Wt 162.0 lb

## 2021-09-21 DIAGNOSIS — Z1211 Encounter for screening for malignant neoplasm of colon: Secondary | ICD-10-CM

## 2021-09-21 MED ORDER — PEG 3350-KCL-NA BICARB-NACL 420 G PO SOLR: 4000.0000 mL | Freq: Once | ORAL | 0 refills | Status: AC

## 2021-09-21 NOTE — Progress Notes (Signed)
No egg or soy allergy known to patient   No issues known to pt with past sedation with any surgeries or procedures  Patient denies ever being told they had issues or difficulty with intubation  Pt only had one surgery- hernia repair, as a child  No FH of Malignant Hyperthermia  Pt is not on diet pills  Pt is not on  home 02   Pt is not on blood thinners-pt states "I stopped taking all of my medication a couple months ago and I feel better than ever"  Pt denies issues with constipation   No A fib or A flutter  Have any cardiac testing pending--no  Pt instructed to use Single[employer]com or GoodRx for a price reduction on prep   Pt states he is feeling fine after stopping meds, pt has not checked his blood pressure since stopping meds, instruct pt that his procedure could be canceled if his bp is out of control.  Instruct pt to check bp and to resume valsartan - hydrochlorothiazide as the prep can increase his bp and he already has been dx with high blood pressure which uncontrolled can put him at risk for a stroke. Pt verb understanding.  Pt cp is with pt today, both verb understanding that cp needs to be here on the premises the entire time the pt is here and needs to drive home.

## 2021-09-27 ENCOUNTER — Encounter: Payer: Medicare Other | Admitting: Gastroenterology

## 2021-09-27 ENCOUNTER — Telehealth: Payer: Self-pay | Admitting: Gastroenterology

## 2021-09-27 NOTE — Telephone Encounter (Signed)
Patients girlfriend called said he asked her to cancel his procedure for today. No reason provided advised her of possible cancellation fee.

## 2021-09-29 ENCOUNTER — Ambulatory Visit (HOSPITAL_BASED_OUTPATIENT_CLINIC_OR_DEPARTMENT_OTHER): Payer: Medicare Other | Admitting: Family Medicine

## 2021-10-04 ENCOUNTER — Encounter (HOSPITAL_BASED_OUTPATIENT_CLINIC_OR_DEPARTMENT_OTHER): Payer: Self-pay | Admitting: Family Medicine

## 2021-10-07 ENCOUNTER — Other Ambulatory Visit: Payer: Self-pay | Admitting: Family Medicine

## 2021-10-17 ENCOUNTER — Telehealth: Payer: Self-pay | Admitting: *Deleted

## 2021-10-17 NOTE — Chronic Care Management (AMB) (Signed)
  Care Coordination  Outreach Note  10/17/2021 Name: Randall Velasquez MRN: [id number] DOB: 10-01-1955   Care Coordination Outreach Attempts  An unsuccessful telephone outreach was attempted today to offer the patient information about available care coordination services as a benefit of their health plan.   Rescheduling   Follow Up Plan:  Additional outreach attempts will be made to offer the patient care coordination information and services.   Encounter Outcome:  No Answer  Burman Nieves, CCMA Care Coordination Care Guide Direct Dial: [phone]

## 2021-11-03 NOTE — Chronic Care Management (AMB) (Signed)
  Care Coordination   Note   11/03/2021 Name: Randall Velasquez MRN: [id number] DOB: November 27, 1955  [name] is a 66 y.o. year old male who sees Sagardia, Ines Bloomer, MD for primary care. I reached out to Alvan Dame by phone today to offer care coordination services.  [name] was given information about Care Coordination services today including:   The Care Coordination services include support from the care team which includes your Nurse Coordinator, Clinical Social Worker, or Pharmacist.  The Care Coordination team is here to help remove barriers to the health concerns and goals most important to you. Care Coordination services are voluntary, and the patient may decline or stop services at any time by request to their care team member.   Care Coordination Consent Status: Patient did not agree to participate in care coordination services at this time.  Follow up plan:  none   Encounter Outcome:  Pt. Refused  Julian Hy, [place] Direct Dial: [phone]

## 2021-12-04 NOTE — Progress Notes (Deleted)
   I, Peterson Lombard, LAT, ATC acting as a scribe for Lynne Leader, MD.  BLONG BUSK is a 66 y.o. male who presents to [place] [facility] today for continued lumbar radiculopathy.  Patient was last seen by Dr. Georgina Snell on 06/13/2021 and a lumbar ESI was ordered, recommended considering a different technique, and later obtained on 06/22/2021.  Today, patient reports  Dx imaging: 11/30/2020 L-spine MRI 09/27/2020 L-spine x-ray  Pertinent review of systems: ***  Relevant historical information: ***   Exam:  There were no vitals taken for this visit. General: Well Developed, well nourished, and in no acute distress.   MSK: ***    Lab and Radiology Results No results found for this or any previous visit (from the past 72 hour(s)). No results found.     Assessment and Plan: 67 y.o. male with ***   PDMP not reviewed this encounter. No orders of the defined types were placed in this encounter.  No orders of the defined types were placed in this encounter.    Discussed warning signs or symptoms. Please see discharge instructions. Patient expresses understanding.   ***

## 2021-12-05 ENCOUNTER — Ambulatory Visit: Payer: Medicare Other | Admitting: Family Medicine

## 2022-01-25 ENCOUNTER — Ambulatory Visit (INDEPENDENT_AMBULATORY_CARE_PROVIDER_SITE_OTHER): Payer: Medicare Other | Admitting: Family Medicine

## 2022-01-25 VITALS — BP 140/88 | Ht 71.0 in | Wt 174.0 lb

## 2022-01-25 DIAGNOSIS — N1831 Chronic kidney disease, stage 3a: Secondary | ICD-10-CM | POA: Diagnosis not present

## 2022-01-25 DIAGNOSIS — M7989 Other specified soft tissue disorders: Secondary | ICD-10-CM

## 2022-01-25 LAB — COMPREHENSIVE METABOLIC PANEL
ALT: 23 U/L (ref 0–53)
AST: 23 U/L (ref 0–37)
Albumin: 3.9 g/dL (ref 3.5–5.2)
Alkaline Phosphatase: 87 U/L (ref 39–117)
BUN: 15 mg/dL (ref 6–23)
CO2: 29 mEq/L (ref 19–32)
Calcium: 9.1 mg/dL (ref 8.4–10.5)
Chloride: 106 mEq/L (ref 96–112)
Creatinine, Ser: 1.21 mg/dL (ref 0.40–1.50)
GFR: 62.5 mL/min (ref >60.00)
Glucose, Bld: 102 mg/dL — ABNORMAL HIGH (ref 70–99)
Potassium: 4 mEq/L (ref 3.5–5.1)
Sodium: 139 mEq/L (ref 135–145)
Total Bilirubin: 0.6 mg/dL (ref 0.2–1.2)
Total Protein: 6.7 g/dL (ref 6.0–8.3)

## 2022-01-25 NOTE — Patient Instructions (Addendum)
Thank you for coming in today.   I think your leg swelling is due to either your heart or your kidneys not working right.   We need to get you back with your regular doctor to get back on some of your medicine and get the dose right because I think you were given too much medicine back in May and it made you feel bad.   If you can find your blood pressure pills (Valsartan-HCTZ) take 1/2 pill daily.   When you leave here today go upstairs and make an appointment with Dr Alvy Bimler for leg swelling and blood pressure.   Please get labs today before you leave

## 2022-01-25 NOTE — Progress Notes (Signed)
I, Randall Velasquez, LAT, ATC acting as a scribe for Randall Graham, MD.  Randall Velasquez is a 66 y.o. male who presents to [facility] today for continued lumbar radiculopathy.  Patient was last seen by Dr. Denyse Velasquez on 06/13/2021 and a lumbar ESI was ordered, recommended considering a different technique, and later performed on 06/22/2021.  Today, Randall Velasquez's main issue is leg swelling bilaterally.  He denies much pain radiating to his legs bilaterally.  He notes that improved significantly following an epidural steroid injection in May 2023.  During that visit he had a what seemed to be a drug rash and I advised him to discontinue the Pletal.  He stopped all of his medicines and has not contacted his cardiologist or his primary care provider.  He is on or taking any medications.  Additionally he notes he was feeling lightheaded and dizzy on his blood pressure and cholesterol medications.  Dx imaging: 11/30/2020 L-spine MRI 09/27/2020 L-spine x-ray   Pertinent review of systems: No fevers or chills.  Positive for leg swelling.  No shortness of breath or palpitations.  Relevant historical information: Hypertension, PAD, CKD   Exam:  BP (!) 140/88   Ht 5\' 11"  (1.803 m)   Wt 174 lb (78.9 kg)   BMI 24.27 kg/m  General: Well Developed, well nourished, and in no acute distress.   MSK: Legs bilaterally 1+ pitting edema.    Lab and Radiology Results Results for orders placed or performed in visit on 01/25/22 (from the past 72 hour(s))  Comprehensive metabolic panel     Status: Abnormal   Collection Time: 01/25/22  3:45 PM  Result Value Ref Range   Sodium 139 135 - 145 mEq/L   Potassium 4.0 3.5 - 5.1 mEq/L   Chloride 106 96 - 112 mEq/L   CO2 29 19 - 32 mEq/L   Glucose, Bld 102 (H) 70 - 99 mg/dL   BUN 15 6 - 23 mg/dL   Creatinine, Ser 01/27/22 0.40 - 1.50 mg/dL   Total Bilirubin 0.6 0.2 - 1.2 mg/dL   Alkaline Phosphatase 87 39 - 117 U/L   AST 23 0 - 37 U/L   ALT  23 0 - 53 U/L   Total Protein 6.7 6.0 - 8.3 g/dL   Albumin 3.9 3.5 - 5.2 g/dL   GFR 9.38 18.29 mL/min    Comment: Calculated using the CKD-EPI Creatinine Equation (2021)   Calcium 9.1 8.4 - 10.5 mg/dL   No results found.     Assessment and Plan: 66 y.o. male with bilateral lower extremity edema.  This occurs in the setting of CKD, hypertension, and vascular disease.  He is not sure if he still even has his blood pressure medications.  I have advised him to restart half pill which would be valsartan hydrochlorothiazide 80/6.25 daily which should be tolerable.  He should contact his primary care provider and get reestablished on medications.  He may benefit from a heart failure workup.  I do not see that that is been done.  Today he does not have bothersome lumbar radiculopathy so does not much need for repeat epidural steroid injection or further workup.  Happy to proceed with that in the future if needed.  I did recheck his renal function.  When last checked his renal function had worsened significantly.   PDMP not reviewed this encounter. Orders Placed This Encounter  Procedures   Comprehensive metabolic panel    Standing Status:   Future  Number of Occurrences:   1    Standing Expiration Date:   01/26/2023   No orders of the defined types were placed in this encounter.    Discussed warning signs or symptoms. Please see discharge instructions. Patient expresses understanding.   The above documentation has been reviewed and is accurate and complete Lynne Leader, M.D.

## 2022-01-26 NOTE — Progress Notes (Signed)
Labs look a little better than they did 6 months ago.  Please follow-up with your primary care doctor.

## 2022-02-08 ENCOUNTER — Encounter: Payer: Self-pay | Admitting: Emergency Medicine

## 2022-02-08 ENCOUNTER — Ambulatory Visit (INDEPENDENT_AMBULATORY_CARE_PROVIDER_SITE_OTHER): Payer: Medicare Other | Admitting: Emergency Medicine

## 2022-02-08 VITALS — BP 138/88 | HR 57 | Temp 98.1°F | Ht 71.0 in | Wt 169.5 lb

## 2022-02-08 DIAGNOSIS — Z1211 Encounter for screening for malignant neoplasm of colon: Secondary | ICD-10-CM | POA: Diagnosis not present

## 2022-02-08 DIAGNOSIS — I10 Essential (primary) hypertension: Secondary | ICD-10-CM

## 2022-02-08 DIAGNOSIS — Z789 Other specified health status: Secondary | ICD-10-CM | POA: Diagnosis not present

## 2022-02-08 DIAGNOSIS — F17200 Nicotine dependence, unspecified, uncomplicated: Secondary | ICD-10-CM | POA: Diagnosis not present

## 2022-02-08 DIAGNOSIS — Z23 Encounter for immunization: Secondary | ICD-10-CM

## 2022-02-08 DIAGNOSIS — E785 Hyperlipidemia, unspecified: Secondary | ICD-10-CM

## 2022-02-08 MED ORDER — AMLODIPINE BESYLATE 5 MG PO TABS: 5.0000 mg | ORAL_TABLET | Freq: Every day | ORAL | 3 refills | Status: DC

## 2022-02-08 NOTE — Assessment & Plan Note (Signed)
Valsartan hydrochlorothiazide 160-12.5 mg too strong for patient.  Developed symptoms of hypotension with dizziness and lightheadedness. Elevated blood pressure reading in the office Recommend to start amlodipine 5 mg daily and monitor blood pressure readings at home daily for the next several weeks Advised to contact the office if numbers persistently abnormal Cardiovascular risk associated with uncontrolled hypertension discussed Dietary approaches to stop hypertension discussed Follow-up in 3 months.

## 2022-02-08 NOTE — Assessment & Plan Note (Signed)
Cardiovascular/cancer risks associated with smoking discussed. Smoking cessation advice given. 

## 2022-02-08 NOTE — Progress Notes (Signed)
Randall Velasquez 66 y.o.   Chief Complaint  Patient presents with   Follow-up    F/u appt, patient is having issues with his veins in his legs    HISTORY OF PRESENT ILLNESS: This is a 66 y.o. male here for 75-month follow-up of hypertension and dyslipidemia Last visit in May patient was started on valsartan hydrochlorothiazide and rosuvastatin However he developed bad leg cramps and feeling lightheaded and dizzy.  He stopped both medications and felt better. Most recent blood work done 01/25/2022 showed normal electrolytes and normal kidney function test. BP Readings from Last 3 Encounters:  02/08/22 138/88  01/25/22 (!) 140/88  07/11/21 110/68     HPI   Prior to Admission medications   Medication Sig Start Date End Date Taking? Authorizing Provider  mometasone (ELOCON) 0.1 % cream APPLY TO AFFECTED AREA TOPICALLY EVERY DAY Patient not taking: Reported on 09/21/2021 07/31/21   [place], Buren Kos, MD  pantoprazole (PROTONIX) 20 MG tablet Take 1 tablet (20 mg total) by mouth daily. Patient not taking: Reported on 09/21/2021 04/18/21   [place], Buren Kos, MD  rosuvastatin (CRESTOR) 20 MG tablet Take 1 tablet (20 mg total) by mouth daily. Patient not taking: Reported on 09/21/2021 07/11/21   Maisie Fus, MD  valsartan-hydrochlorothiazide (DIOVAN-HCT) 160-12.5 MG tablet Take 1 tablet by mouth daily. Patient not taking: Reported on 09/21/2021 06/29/21   Georgina Quint, MD    No Known Allergies  Patient Active Problem List   Diagnosis Date Noted   Rash 06/29/2021   History of gastroesophageal reflux (GERD) 06/29/2021   Dyslipidemia 06/29/2021   CKD (chronic kidney disease) stage 3, GFR 30-59 ml/min (HCC) 05/30/2020   Current smoker 05/26/2020   Chronic left-sided thoracic back pain 05/26/2020   PAD (peripheral artery disease) (HCC) 05/19/2020   Essential hypertension 09/30/2019   Gastroesophageal reflux disease 09/30/2019   Subcutaneous mass 09/30/2019    Past Medical  History:  Diagnosis Date   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   GERD (gastroesophageal reflux disease)    Hyperlipidemia    Hypertension     Past Surgical History:  Procedure Laterality Date   HERNIA REPAIR     as a child    Social History   Socioeconomic History   Marital status: Single    Spouse name: Not on file   Number of children: Not on file   Years of education: Not on file   Highest education level: Not on file  Occupational History   Not on file  Tobacco Use   Smoking status: Every Day    Types: Cigars   Smokeless tobacco: Never  Vaping Use   Vaping Use: Never used  Substance and Sexual Activity   Alcohol use: Yes    Alcohol/week: 2.0 standard drinks of alcohol    Types: 2 Shots of liquor per week   Drug use: Not Currently    Types: Marijuana    Comment: stopped in 20s   Sexual activity: Yes  Other Topics Concern   Not on file  Social History Narrative   Not on file   Social Determinants of Health   Financial Resource Strain: Not on file  Food Insecurity: Not on file  Transportation Needs: Not on file  Physical Activity: Not on file  Stress: Not on file  Social Connections: Not on file  Intimate Partner Violence: Not on file    Family History  Problem Relation Age  of Onset   Colon cancer Neg Hx    Colon polyps Neg Hx    Esophageal cancer Neg Hx    Stomach cancer Neg Hx    Rectal cancer Neg Hx      Review of Systems  Constitutional: Negative.  Negative for chills and fever.  HENT: Negative.  Negative for congestion and sore throat.   Cardiovascular: Negative.  Negative for chest pain and palpitations.  Gastrointestinal:  Negative for abdominal pain, diarrhea, nausea and vomiting.  Genitourinary: Negative.  Negative for dysuria and hematuria.  Skin: Negative.  Negative for rash.  Neurological: Negative.  Negative for dizziness and headaches.  All other systems reviewed and are  negative.  Today's Vitals   02/08/22 1010  BP: 138/88  Pulse: (!) 57  Temp: 98.1 F (36.7 C)  TempSrc: Oral  SpO2: 97%  Weight: 169 lb 8 oz (76.9 kg)  Height: 5\' 11"  (1.803 m)   Body mass index is 23.64 kg/m.   Physical Exam Vitals reviewed.  Constitutional:      Appearance: Normal appearance.  HENT:     Head: Normocephalic.     Mouth/Throat:     Mouth: Mucous membranes are moist.     Pharynx: Oropharynx is clear.  Eyes:     Extraocular Movements: Extraocular movements intact.     Pupils: Pupils are equal, round, and reactive to light.  Cardiovascular:     Rate and Rhythm: Normal rate and regular rhythm.     Pulses: Normal pulses.     Heart sounds: Normal heart sounds.  Pulmonary:     Effort: Pulmonary effort is normal.     Breath sounds: Normal breath sounds.  Musculoskeletal:     Cervical back: No tenderness.     Right lower leg: No edema.     Left lower leg: No edema.  Lymphadenopathy:     Cervical: No cervical adenopathy.  Skin:    General: Skin is warm and dry.  Neurological:     General: No focal deficit present.     Mental Status: He is alert and oriented to person, place, and time.  Psychiatric:        Mood and Affect: Mood normal.        Behavior: Behavior normal.      ASSESSMENT & PLAN: A total of 46 minutes was spent with the patient and counseling/coordination of care regarding preparing for this visit, review of most recent office visit notes, review of most recent blood work results, review of chronic medical conditions and their management, review of all medications and changes made, cardiovascular risks associated with uncontrolled hypertension, smoking cessation advised, education on nutrition, prognosis, documentation, and need for follow-up in 3 months.  Problem List Items Addressed This Visit       Cardiovascular and Mediastinum   Essential hypertension - Primary    Valsartan hydrochlorothiazide 160-12.5 mg too strong for patient.   Developed symptoms of hypotension with dizziness and lightheadedness. Elevated blood pressure reading in the office Recommend to start amlodipine 5 mg daily and monitor blood pressure readings at home daily for the next several weeks Advised to contact the office if numbers persistently abnormal Cardiovascular risk associated with uncontrolled hypertension discussed Dietary approaches to stop hypertension discussed Follow-up in 3 months.      Relevant Medications   amLODipine (NORVASC) 5 MG tablet     Other   Current smoker    Cardiovascular/cancer risks associated with smoking discussed.  Smoking cessation advice given.  Dyslipidemia    Normal lipid profile last May. Intolerant to statins.       Statin intolerance   Other Visit Diagnoses     Colon cancer screening       Relevant Orders   Ambulatory referral to Gastroenterology   Need for vaccination       Relevant Orders   Flu Vaccine QUAD High Dose(Fluad)      Patient Instructions  Hypertension, Adult High blood pressure (hypertension) is when the force of blood pumping through the arteries is too strong. The arteries are the blood vessels that carry blood from the heart throughout the body. Hypertension forces the heart to work harder to pump blood and may cause arteries to become narrow or stiff. Untreated or uncontrolled hypertension can lead to a heart attack, heart failure, a stroke, kidney disease, and other problems. A blood pressure reading consists of a higher number over a lower number. Ideally, your blood pressure should be below 120/80. The first ("top") number is called the systolic pressure. It is a measure of the pressure in your arteries as your heart beats. The second ("bottom") number is called the diastolic pressure. It is a measure of the pressure in your arteries as the heart relaxes. What are the causes? The exact cause of this condition is not known. There are some conditions that result in high  blood pressure. What increases the risk? Certain factors may make you more likely to develop high blood pressure. Some of these risk factors are under your control, including: Smoking. Not getting enough exercise or physical activity. Being overweight. Having too much fat, sugar, calories, or salt (sodium) in your diet. Drinking too much alcohol. Other risk factors include: Having a personal history of heart disease, diabetes, high cholesterol, or kidney disease. Stress. Having a family history of high blood pressure and high cholesterol. Having obstructive sleep apnea. Age. The risk increases with age. What are the signs or symptoms? High blood pressure may not cause symptoms. Very high blood pressure (hypertensive crisis) may cause: Headache. Fast or irregular heartbeats (palpitations). Shortness of breath. Nosebleed. Nausea and vomiting. Vision changes. Severe chest pain, dizziness, and seizures. How is this diagnosed? This condition is diagnosed by measuring your blood pressure while you are seated, with your arm resting on a flat surface, your legs uncrossed, and your feet flat on the floor. The cuff of the blood pressure monitor will be placed directly against the skin of your upper arm at the level of your heart. Blood pressure should be measured at least twice using the same arm. Certain conditions can cause a difference in blood pressure between your right and left arms. If you have a high blood pressure reading during one visit or you have normal blood pressure with other risk factors, you may be asked to: Return on a different day to have your blood pressure checked again. Monitor your blood pressure at home for 1 week or longer. If you are diagnosed with hypertension, you may have other blood or imaging tests to help your health care provider understand your overall risk for other conditions. How is this treated? This condition is treated by making healthy lifestyle changes,  such as eating healthy foods, exercising more, and reducing your alcohol intake. You may be referred for counseling on a healthy diet and physical activity. Your health care provider may prescribe medicine if lifestyle changes are not enough to get your blood pressure under control and if: Your systolic blood pressure is above 130.  Your diastolic blood pressure is above 80. Your personal target blood pressure may vary depending on your medical conditions, your age, and other factors. Follow these instructions at home: Eating and drinking  Eat a diet that is high in fiber and potassium, and low in sodium, added sugar, and fat. An example of this eating plan is called the DASH diet. DASH stands for Dietary Approaches to Stop Hypertension. To eat this way: Eat plenty of fresh fruits and vegetables. Try to fill one half of your plate at each meal with fruits and vegetables. Eat whole grains, such as whole-wheat pasta, brown rice, or whole-grain bread. Fill about one fourth of your plate with whole grains. Eat or drink low-fat dairy products, such as skim milk or low-fat yogurt. Avoid fatty cuts of meat, processed or cured meats, and poultry with skin. Fill about one fourth of your plate with lean proteins, such as fish, chicken without skin, beans, eggs, or tofu. Avoid pre-made and processed foods. These tend to be higher in sodium, added sugar, and fat. Reduce your daily sodium intake. Many people with hypertension should eat less than 1,500 mg of sodium a day. Do not drink alcohol if: Your health care provider tells you not to drink. You are pregnant, may be pregnant, or are planning to become pregnant. If you drink alcohol: Limit how much you have to: 0-1 drink a day for women. 0-2 drinks a day for men. Know how much alcohol is in your drink. In the [place] one drink equals one 12 oz bottle of beer (355 mL), one 5 oz glass of wine (148 mL), or one 1 oz glass of hard liquor (44  mL). Lifestyle  Work with your health care provider to maintain a healthy body weight or to lose weight. Ask what an ideal weight is for you. Get at least 30 minutes of exercise that causes your heart to beat faster (aerobic exercise) most days of the week. Activities may include walking, swimming, or biking. Include exercise to strengthen your muscles (resistance exercise), such as Pilates or lifting weights, as part of your weekly exercise routine. Try to do these types of exercises for 30 minutes at least 3 days a week. Do not use any products that contain nicotine or tobacco. These products include cigarettes, chewing tobacco, and vaping devices, such as e-cigarettes. If you need help quitting, ask your health care provider. Monitor your blood pressure at home as told by your health care provider. Keep all follow-up visits. This is important. Medicines Take over-the-counter and prescription medicines only as told by your health care provider. Follow directions carefully. Blood pressure medicines must be taken as prescribed. Do not skip doses of blood pressure medicine. Doing this puts you at risk for problems and can make the medicine less effective. Ask your health care provider about side effects or reactions to medicines that you should watch for. Contact a health care provider if you: Think you are having a reaction to a medicine you are taking. Have headaches that keep coming back (recurring). Feel dizzy. Have swelling in your ankles. Have trouble with your vision. Get help right away if you: Develop a severe headache or confusion. Have unusual weakness or numbness. Feel faint. Have severe pain in your chest or abdomen. Vomit repeatedly. Have trouble breathing. These symptoms may be an emergency. Get help right away. Call 911. Do not wait to see if the symptoms will go away. Do not drive yourself to the hospital. Summary Hypertension is when  the force of blood pumping through  your arteries is too strong. If this condition is not controlled, it may put you at risk for serious complications. Your personal target blood pressure may vary depending on your medical conditions, your age, and other factors. For most people, a normal blood pressure is less than 120/80. Hypertension is treated with lifestyle changes, medicines, or a combination of both. Lifestyle changes include losing weight, eating a healthy, low-sodium diet, exercising more, and limiting alcohol. This information is not intended to replace advice given to you by your health care provider. Make sure you discuss any questions you have with your health care provider. Document Revised: 12/06/2020 Document Reviewed: 12/06/2020 Elsevier Patient Education  2023 Elsevier Inc.    Edwina BarthMiguel Jessica Checketts, MD [facility]

## 2022-02-08 NOTE — Patient Instructions (Signed)
Hypertension, Adult High blood pressure (hypertension) is when the force of blood pumping through the arteries is too strong. The arteries are the blood vessels that carry blood from the heart throughout the body. Hypertension forces the heart to work harder to pump blood and may cause arteries to become narrow or stiff. Untreated or uncontrolled hypertension can lead to a heart attack, heart failure, a stroke, kidney disease, and other problems. A blood pressure reading consists of a higher number over a lower number. Ideally, your blood pressure should be below 120/80. The first ("top") number is called the systolic pressure. It is a measure of the pressure in your arteries as your heart beats. The second ("bottom") number is called the diastolic pressure. It is a measure of the pressure in your arteries as the heart relaxes. What are the causes? The exact cause of this condition is not known. There are some conditions that result in high blood pressure. What increases the risk? Certain factors may make you more likely to develop high blood pressure. Some of these risk factors are under your control, including: Smoking. Not getting enough exercise or physical activity. Being overweight. Having too much fat, sugar, calories, or salt (sodium) in your diet. Drinking too much alcohol. Other risk factors include: Having a personal history of heart disease, diabetes, high cholesterol, or kidney disease. Stress. Having a family history of high blood pressure and high cholesterol. Having obstructive sleep apnea. Age. The risk increases with age. What are the signs or symptoms? High blood pressure may not cause symptoms. Very high blood pressure (hypertensive crisis) may cause: Headache. Fast or irregular heartbeats (palpitations). Shortness of breath. Nosebleed. Nausea and vomiting. Vision changes. Severe chest pain, dizziness, and seizures. How is this diagnosed? This condition is diagnosed by  measuring your blood pressure while you are seated, with your arm resting on a flat surface, your legs uncrossed, and your feet flat on the floor. The cuff of the blood pressure monitor will be placed directly against the skin of your upper arm at the level of your heart. Blood pressure should be measured at least twice using the same arm. Certain conditions can cause a difference in blood pressure between your right and left arms. If you have a high blood pressure reading during one visit or you have normal blood pressure with other risk factors, you may be asked to: Return on a different day to have your blood pressure checked again. Monitor your blood pressure at home for 1 week or longer. If you are diagnosed with hypertension, you may have other blood or imaging tests to help your health care provider understand your overall risk for other conditions. How is this treated? This condition is treated by making healthy lifestyle changes, such as eating healthy foods, exercising more, and reducing your alcohol intake. You may be referred for counseling on a healthy diet and physical activity. Your health care provider may prescribe medicine if lifestyle changes are not enough to get your blood pressure under control and if: Your systolic blood pressure is above 130. Your diastolic blood pressure is above 80. Your personal target blood pressure may vary depending on your medical conditions, your age, and other factors. Follow these instructions at home: Eating and drinking  Eat a diet that is high in fiber and potassium, and low in sodium, added sugar, and fat. An example of this eating plan is called the DASH diet. DASH stands for Dietary Approaches to Stop Hypertension. To eat this way: Eat   plenty of fresh fruits and vegetables. Try to fill one half of your plate at each meal with fruits and vegetables. Eat whole grains, such as whole-wheat pasta, brown rice, or whole-grain bread. Fill about one  fourth of your plate with whole grains. Eat or drink low-fat dairy products, such as skim milk or low-fat yogurt. Avoid fatty cuts of meat, processed or cured meats, and poultry with skin. Fill about one fourth of your plate with lean proteins, such as fish, chicken without skin, beans, eggs, or tofu. Avoid pre-made and processed foods. These tend to be higher in sodium, added sugar, and fat. Reduce your daily sodium intake. Many people with hypertension should eat less than 1,500 mg of sodium a day. Do not drink alcohol if: Your health care provider tells you not to drink. You are pregnant, may be pregnant, or are planning to become pregnant. If you drink alcohol: Limit how much you have to: 0-1 drink a day for women. 0-2 drinks a day for men. Know how much alcohol is in your drink. In the [place] one drink equals one 12 oz bottle of beer (355 mL), one 5 oz glass of wine (148 mL), or one 1 oz glass of hard liquor (44 mL). Lifestyle  Work with your health care provider to maintain a healthy body weight or to lose weight. Ask what an ideal weight is for you. Get at least 30 minutes of exercise that causes your heart to beat faster (aerobic exercise) most days of the week. Activities may include walking, swimming, or biking. Include exercise to strengthen your muscles (resistance exercise), such as Pilates or lifting weights, as part of your weekly exercise routine. Try to do these types of exercises for 30 minutes at least 3 days a week. Do not use any products that contain nicotine or tobacco. These products include cigarettes, chewing tobacco, and vaping devices, such as e-cigarettes. If you need help quitting, ask your health care provider. Monitor your blood pressure at home as told by your health care provider. Keep all follow-up visits. This is important. Medicines Take over-the-counter and prescription medicines only as told by your health care provider. Follow directions carefully. Blood  pressure medicines must be taken as prescribed. Do not skip doses of blood pressure medicine. Doing this puts you at risk for problems and can make the medicine less effective. Ask your health care provider about side effects or reactions to medicines that you should watch for. Contact a health care provider if you: Think you are having a reaction to a medicine you are taking. Have headaches that keep coming back (recurring). Feel dizzy. Have swelling in your ankles. Have trouble with your vision. Get help right away if you: Develop a severe headache or confusion. Have unusual weakness or numbness. Feel faint. Have severe pain in your chest or abdomen. Vomit repeatedly. Have trouble breathing. These symptoms may be an emergency. Get help right away. Call 911. Do not wait to see if the symptoms will go away. Do not drive yourself to the hospital. Summary Hypertension is when the force of blood pumping through your arteries is too strong. If this condition is not controlled, it may put you at risk for serious complications. Your personal target blood pressure may vary depending on your medical conditions, your age, and other factors. For most people, a normal blood pressure is less than 120/80. Hypertension is treated with lifestyle changes, medicines, or a combination of both. Lifestyle changes include losing weight, eating a healthy,   low-sodium diet, exercising more, and limiting alcohol. This information is not intended to replace advice given to you by your health care provider. Make sure you discuss any questions you have with your health care provider. Document Revised: 12/06/2020 Document Reviewed: 12/06/2020 Elsevier Patient Education  2023 Else[employer] Inc.  

## 2022-02-08 NOTE — Assessment & Plan Note (Signed)
Normal lipid profile last May. Intolerant to statins.

## 2022-03-21 ENCOUNTER — Ambulatory Visit: Payer: Medicare Other

## 2022-04-23 ENCOUNTER — Ambulatory Visit (INDEPENDENT_AMBULATORY_CARE_PROVIDER_SITE_OTHER): Payer: Medicare Other | Admitting: Family Medicine

## 2022-04-23 ENCOUNTER — Encounter: Payer: Self-pay | Admitting: Family Medicine

## 2022-04-23 ENCOUNTER — Ambulatory Visit: Payer: Self-pay

## 2022-04-23 ENCOUNTER — Ambulatory Visit (INDEPENDENT_AMBULATORY_CARE_PROVIDER_SITE_OTHER): Payer: Medicare Other

## 2022-04-23 VITALS — BP 168/84 | HR 36 | Ht 71.0 in | Wt 163.8 lb

## 2022-04-23 DIAGNOSIS — G8929 Other chronic pain: Secondary | ICD-10-CM

## 2022-04-23 DIAGNOSIS — M25512 Pain in left shoulder: Secondary | ICD-10-CM

## 2022-04-23 MED ORDER — LIDOCAINE 4 % EX PTCH: 1.0000 | MEDICATED_PATCH | CUTANEOUS | 12 refills | Status: DC

## 2022-04-23 NOTE — Progress Notes (Signed)
Shirlyn Goltz, PhD, LAT, ATC acting as a scribe for Lynne Leader, MD.  Randall Velasquez is a 67 y.o. male who presents to [place] at [facility] today for L shoulder pain. Pt was previously seen by Dr. Georgina Snell on 01/25/22 for lumbar radiculopathy.  Today, pt c/o L shoulder pain x 2-3 weeks, MOI: was reaching towards a cobweb and suddenly felt pain. Pt locates pain to top of the shoulder and into the neck. Pain radiating into the deltoid, no pain beyond the elbow. Denies previous injury to the shoulder.   Neck pain: yes Radiates: deltoid UE Numbness/tingling: yes UE Weakness: no Aggravates: reaching up Treatments tried: topical analgesic, rest - short-term relief  Pertinent review of systems: No fevers or chills.    Relevant historical information: Hypertension.   Exam:  BP (!) 168/84   Pulse (!) 36   Ht '5\' 11"'$  (1.803 m)   Wt 163 lb 12.8 oz (74.3 kg)   SpO2 100%   BMI 22.85 kg/m  General: Well Developed, well nourished, and in no acute distress.   MSK: Left shoulder: Normal-appearing Nontender. Normal motion pain with abduction. Strength abduction 4/5.  Intact external and internal rotation. Positive Hawkins and Neer's test. Negative Yergason's and speeds test. Pulses cap refill and sensation are intact distally.    Lab and Radiology Results  Procedure: Real-time Ultrasound Guided Injection of left shoulder subacromial bursa Device: Philips Affiniti 50G Images permanently stored and available for review in PACS Ultrasound evaluation prior to injection shows intact appearing rotator cuff tendons.  Mild subacromial bursitis is present. Verbal informed consent obtained.  Discussed risks and benefits of procedure. Warned about infection, bleeding, hyperglycemia damage to structures among others. Patient expresses understanding and agreement Time-out conducted.   Noted no overlying erythema, induration, or other signs of local infection.   Skin  prepped in a sterile fashion.   Local anesthesia: Topical Ethyl chloride.   With sterile technique and under real time ultrasound guidance: 40 mg of Kenalog and 2 mL Marcaine injected into subacromial bursa. Fluid seen entering the bursa.   Completed without difficulty   Pain immediately resolved suggesting accurate placement of the medication.   Advised to call if fevers/chills, erythema, induration, drainage, or persistent bleeding.   Images permanently stored and available for review in the ultrasound unit.  Impression: Technically successful ultrasound guided injection.     X-ray images left shoulder obtained today personally and independently interpreted No acute fractures.  Mild AC DJD. Await formal radiology review   Assessment and Plan: 67 y.o. male with left shoulder pain thought to be due to rotator cuff impingement and bursitis.  Plan for subacromial injection today.  Additionally patient request lidocaine patches which I think is reasonable.  I prescribed those.  I am not sure that his insurance will pay for them but reasonable to use.  Await radiology over read of the x-ray.   PDMP not reviewed this encounter. Orders Placed This Encounter  Procedures   [place] LIMITED JOINT SPACE STRUCTURES UP LEFT(NO LINKED CHARGES)    Order Specific Question:   Reason for Exam (SYMPTOM  OR DIAGNOSIS REQUIRED)    Answer:   lt shoulder    Order Specific Question:   Preferred imaging location?    Answer:   [place]   DG Shoulder Left    Standing Status:   Future    Number of Occurrences:   1    Standing Expiration Date:   04/23/2023  Order Specific Question:   Reason for Exam (SYMPTOM  OR DIAGNOSIS REQUIRED)    Answer:   eval left shoudler pain    Order Specific Question:   Preferred imaging location?    Answer:   [facility]   Meds ordered this encounter  Medications   lidocaine (HM LIDOCAINE PATCH) 4 %    Sig: Place 1 patch onto the skin daily.     Dispense:  30 patch    Refill:  12     Discussed warning signs or symptoms. Please see discharge instructions. Patient expresses understanding.   The above documentation has been reviewed and is accurate and complete Lynne Leader, M.D.

## 2022-04-23 NOTE — Patient Instructions (Addendum)
Thank you for coming in today.   Please get an Xray today before you leave   Call or go to the ER if you develop a large red swollen joint with extreme pain or oozing puss.    Recheck in 6 weeks.

## 2022-04-24 NOTE — Progress Notes (Signed)
Left shoulder x-ray looks okay to radiology.  No fracture.  No severe arthritis.

## 2022-04-27 ENCOUNTER — Telehealth: Payer: Self-pay

## 2022-04-27 NOTE — Telephone Encounter (Signed)
Called and spoke with Paulette (on DPR) and advise of results per Dr. Georgina Snell.   Paulette requests that message be sent to Dr. Georgina Snell to see what is recommended for his shoulder pain. He has tried OTC analgesic, NSAID, and muscle relaxer with minimal relief of sx.   Forwarding to Dr. Georgina Snell to advise.

## 2022-05-01 NOTE — Telephone Encounter (Signed)
Called [phone] and left VM for pt to call the office.

## 2022-05-01 NOTE — Telephone Encounter (Signed)
Next step is probably physical therapy.  Would you like a referral?

## 2022-05-09 ENCOUNTER — Ambulatory Visit (INDEPENDENT_AMBULATORY_CARE_PROVIDER_SITE_OTHER): Payer: Medicare Other | Admitting: Emergency Medicine

## 2022-05-09 ENCOUNTER — Telehealth: Payer: Self-pay

## 2022-05-09 ENCOUNTER — Ambulatory Visit: Payer: Medicare Other

## 2022-05-09 ENCOUNTER — Encounter: Payer: Self-pay | Admitting: Emergency Medicine

## 2022-05-09 ENCOUNTER — Encounter: Payer: Self-pay | Admitting: Gastroenterology

## 2022-05-09 VITALS — BP 130/80 | Temp 98.2°F | Ht 71.0 in | Wt 167.2 lb

## 2022-05-09 DIAGNOSIS — I10 Essential (primary) hypertension: Secondary | ICD-10-CM | POA: Diagnosis not present

## 2022-05-09 DIAGNOSIS — K219 Gastro-esophageal reflux disease without esophagitis: Secondary | ICD-10-CM

## 2022-05-09 DIAGNOSIS — E785 Hyperlipidemia, unspecified: Secondary | ICD-10-CM | POA: Diagnosis not present

## 2022-05-09 DIAGNOSIS — Z1211 Encounter for screening for malignant neoplasm of colon: Secondary | ICD-10-CM

## 2022-05-09 DIAGNOSIS — F17200 Nicotine dependence, unspecified, uncomplicated: Secondary | ICD-10-CM

## 2022-05-09 LAB — CBC WITH DIFFERENTIAL/PLATELET
Basophils Absolute: 0 10*3/uL (ref 0.0–0.1)
Basophils Relative: 0.5 % (ref 0.0–3.0)
Eosinophils Absolute: 0.2 10*3/uL (ref 0.0–0.7)
Eosinophils Relative: 3 % (ref 0.0–5.0)
HCT: 36.1 % — ABNORMAL LOW (ref 39.0–52.0)
Hemoglobin: 12.8 g/dL — ABNORMAL LOW (ref 13.0–17.0)
Lymphocytes Relative: 28.2 % (ref 12.0–46.0)
Lymphs Abs: 1.8 10*3/uL (ref 0.7–4.0)
MCHC: 35.3 g/dL (ref 30.0–36.0)
MCV: 94.2 fl (ref 78.0–100.0)
Monocytes Absolute: 0.6 10*3/uL (ref 0.1–1.0)
Monocytes Relative: 8.9 % (ref 3.0–12.0)
Neutro Abs: 3.8 10*3/uL (ref 1.4–7.7)
Neutrophils Relative %: 59.4 % (ref 43.0–77.0)
Platelets: 102 10*3/uL — ABNORMAL LOW (ref 150.0–400.0)
RBC: 3.83 Mil/uL — ABNORMAL LOW (ref 4.22–5.81)
RDW: 14.4 % (ref 11.5–15.5)
WBC: 6.4 10*3/uL (ref 4.0–10.5)

## 2022-05-09 LAB — COMPREHENSIVE METABOLIC PANEL
ALT: 29 U/L (ref 0–53)
AST: 31 U/L (ref 0–37)
Albumin: 3.8 g/dL (ref 3.5–5.2)
Alkaline Phosphatase: 77 U/L (ref 39–117)
BUN: 20 mg/dL (ref 6–23)
CO2: 29 mEq/L (ref 19–32)
Calcium: 9 mg/dL (ref 8.4–10.5)
Chloride: 106 mEq/L (ref 96–112)
Creatinine, Ser: 1.16 mg/dL (ref 0.40–1.50)
GFR: 65.62 mL/min (ref >60.00)
Glucose, Bld: 75 mg/dL (ref 70–99)
Potassium: 4.6 mEq/L (ref 3.5–5.1)
Sodium: 139 mEq/L (ref 135–145)
Total Bilirubin: 0.9 mg/dL (ref 0.2–1.2)
Total Protein: 6.5 g/dL (ref 6.0–8.3)

## 2022-05-09 LAB — LIPID PANEL
Cholesterol: 110 mg/dL (ref 0–200)
HDL: 74.1 mg/dL (ref >39.00)
LDL Cholesterol: 28 mg/dL (ref 0–99)
NonHDL: 35.83
Total CHOL/HDL Ratio: 1
Triglycerides: 38 mg/dL (ref 0.0–149.0)
VLDL: 7.6 mg/dL (ref 0.0–40.0)

## 2022-05-09 NOTE — Assessment & Plan Note (Signed)
Cardiovascular and cancer risks associated with smoking discussed. °Smoking cessation advice given. °

## 2022-05-09 NOTE — Patient Instructions (Signed)
Hypertension, Adult High blood pressure (hypertension) is when the force of blood pumping through the arteries is too strong. The arteries are the blood vessels that carry blood from the heart throughout the body. Hypertension forces the heart to work harder to pump blood and may cause arteries to become narrow or stiff. Untreated or uncontrolled hypertension can lead to a heart attack, heart failure, a stroke, kidney disease, and other problems. A blood pressure reading consists of a higher number over a lower number. Ideally, your blood pressure should be below 120/80. The first ("top") number is called the systolic pressure. It is a measure of the pressure in your arteries as your heart beats. The second ("bottom") number is called the diastolic pressure. It is a measure of the pressure in your arteries as the heart relaxes. What are the causes? The exact cause of this condition is not known. There are some conditions that result in high blood pressure. What increases the risk? Certain factors may make you more likely to develop high blood pressure. Some of these risk factors are under your control, including: Smoking. Not getting enough exercise or physical activity. Being overweight. Having too much fat, sugar, calories, or salt (sodium) in your diet. Drinking too much alcohol. Other risk factors include: Having a personal history of heart disease, diabetes, high cholesterol, or kidney disease. Stress. Having a family history of high blood pressure and high cholesterol. Having obstructive sleep apnea. Age. The risk increases with age. What are the signs or symptoms? High blood pressure may not cause symptoms. Very high blood pressure (hypertensive crisis) may cause: Headache. Fast or irregular heartbeats (palpitations). Shortness of breath. Nosebleed. Nausea and vomiting. Vision changes. Severe chest pain, dizziness, and seizures. How is this diagnosed? This condition is diagnosed by  measuring your blood pressure while you are seated, with your arm resting on a flat surface, your legs uncrossed, and your feet flat on the floor. The cuff of the blood pressure monitor will be placed directly against the skin of your upper arm at the level of your heart. Blood pressure should be measured at least twice using the same arm. Certain conditions can cause a difference in blood pressure between your right and left arms. If you have a high blood pressure reading during one visit or you have normal blood pressure with other risk factors, you may be asked to: Return on a different day to have your blood pressure checked again. Monitor your blood pressure at home for 1 week or longer. If you are diagnosed with hypertension, you may have other blood or imaging tests to help your health care provider understand your overall risk for other conditions. How is this treated? This condition is treated by making healthy lifestyle changes, such as eating healthy foods, exercising more, and reducing your alcohol intake. You may be referred for counseling on a healthy diet and physical activity. Your health care provider may prescribe medicine if lifestyle changes are not enough to get your blood pressure under control and if: Your systolic blood pressure is above 130. Your diastolic blood pressure is above 80. Your personal target blood pressure may vary depending on your medical conditions, your age, and other factors. Follow these instructions at home: Eating and drinking  Eat a diet that is high in fiber and potassium, and low in sodium, added sugar, and fat. An example of this eating plan is called the DASH diet. DASH stands for Dietary Approaches to Stop Hypertension. To eat this way: Eat   plenty of fresh fruits and vegetables. Try to fill one half of your plate at each meal with fruits and vegetables. Eat whole grains, such as whole-wheat pasta, brown rice, or whole-grain bread. Fill about one  fourth of your plate with whole grains. Eat or drink low-fat dairy products, such as skim milk or low-fat yogurt. Avoid fatty cuts of meat, processed or cured meats, and poultry with skin. Fill about one fourth of your plate with lean proteins, such as fish, chicken without skin, beans, eggs, or tofu. Avoid pre-made and processed foods. These tend to be higher in sodium, added sugar, and fat. Reduce your daily sodium intake. Many people with hypertension should eat less than 1,500 mg of sodium a day. Do not drink alcohol if: Your health care provider tells you not to drink. You are pregnant, may be pregnant, or are planning to become pregnant. If you drink alcohol: Limit how much you have to: 0-1 drink a day for women. 0-2 drinks a day for men. Know how much alcohol is in your drink. In the [place] one drink equals one 12 oz bottle of beer (355 mL), one 5 oz glass of wine (148 mL), or one 1 oz glass of hard liquor (44 mL). Lifestyle  Work with your health care provider to maintain a healthy body weight or to lose weight. Ask what an ideal weight is for you. Get at least 30 minutes of exercise that causes your heart to beat faster (aerobic exercise) most days of the week. Activities may include walking, swimming, or biking. Include exercise to strengthen your muscles (resistance exercise), such as Pilates or lifting weights, as part of your weekly exercise routine. Try to do these types of exercises for 30 minutes at least 3 days a week. Do not use any products that contain nicotine or tobacco. These products include cigarettes, chewing tobacco, and vaping devices, such as e-cigarettes. If you need help quitting, ask your health care provider. Monitor your blood pressure at home as told by your health care provider. Keep all follow-up visits. This is important. Medicines Take over-the-counter and prescription medicines only as told by your health care provider. Follow directions carefully. Blood  pressure medicines must be taken as prescribed. Do not skip doses of blood pressure medicine. Doing this puts you at risk for problems and can make the medicine less effective. Ask your health care provider about side effects or reactions to medicines that you should watch for. Contact a health care provider if you: Think you are having a reaction to a medicine you are taking. Have headaches that keep coming back (recurring). Feel dizzy. Have swelling in your ankles. Have trouble with your vision. Get help right away if you: Develop a severe headache or confusion. Have unusual weakness or numbness. Feel faint. Have severe pain in your chest or abdomen. Vomit repeatedly. Have trouble breathing. These symptoms may be an emergency. Get help right away. Call 911. Do not wait to see if the symptoms will go away. Do not drive yourself to the hospital. Summary Hypertension is when the force of blood pumping through your arteries is too strong. If this condition is not controlled, it may put you at risk for serious complications. Your personal target blood pressure may vary depending on your medical conditions, your age, and other factors. For most people, a normal blood pressure is less than 120/80. Hypertension is treated with lifestyle changes, medicines, or a combination of both. Lifestyle changes include losing weight, eating a healthy,   low-sodium diet, exercising more, and limiting alcohol. This information is not intended to replace advice given to you by your health care provider. Make sure you discuss any questions you have with your health care provider. Document Revised: 12/06/2020 Document Reviewed: 12/06/2020 Elsevier Patient Education  2023 Else[employer] Inc.  

## 2022-05-09 NOTE — Telephone Encounter (Signed)
Called patient lvm to return call, to complete AWV at [phone].  If no return call within 15 minutes, patient may reschedule for the next available appointment with [facility] or [facility].  Markeria Goetsch N. Abigael Mogle, LPN. [facility] AWV Team Direct Dial: [phone]  

## 2022-05-09 NOTE — Assessment & Plan Note (Signed)
Well-controlled hypertension Continue amlodipine 5 mg daily Cardiovascular risks associated with hypertension discussed Diet and nutrition discussed Dietary approaches to stop hypertension discussed Benefits of exercise discussed Smoking cessation advice given Advised to monitor blood pressure readings at home daily for the next several weeks and keep a log.  Advised to contact the office if numbers persistently abnormal.

## 2022-05-09 NOTE — Assessment & Plan Note (Signed)
Stable and well-controlled Takes pantoprazole 20 mg as needed

## 2022-05-09 NOTE — Progress Notes (Signed)
Randall Velasquez 67 y.o.   Chief Complaint  Patient presents with   Medical Management of Chronic Issues    6mnth f/u appt, left shoulder pain, numbness in fingers     HISTORY OF PRESENT ILLNESS: This is a 67 y.o. male here for 43-month follow-up of hypertension. Has occasional left shoulder pain with some numbness to the fingers. Still smoking. No other complaints or medical concerns today.  HPI   Prior to Admission medications   Medication Sig Start Date End Date Taking? Authorizing Provider  amLODipine (NORVASC) 5 MG tablet Take 1 tablet (5 mg total) by mouth daily. 02/08/22  Yes Layci Stenglein, Ines Bloomer, MD  lidocaine (HM LIDOCAINE PATCH) 4 % Place 1 patch onto the skin daily. 04/23/22  Yes Gregor Hams, MD  mometasone (ELOCON) 0.1 % cream APPLY TO AFFECTED AREA TOPICALLY EVERY DAY 07/31/21  Yes de [place], Raymond J, MD  pantoprazole (PROTONIX) 20 MG tablet Take 1 tablet (20 mg total) by mouth daily. 04/18/21  Yes de [place], Raymond J, MD  rosuvastatin (CRESTOR) 20 MG tablet Take 20 mg by mouth daily. 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   states had clot at spine caused numbess to legs   GERD (gastroesophageal reflux disease)    Hyperlipidemia    Hypertension     Past Surgical History:  Procedure Laterality Date   HERNIA REPAIR     as a child    Social History   Socioeconomic History   Marital status: Single    Spouse name: Not on file   Number of  children: Not on file   Years of education: Not on file   Highest education level: Not on file  Occupational History   Not on file  Tobacco Use   Smoking status: Every Day    Types: Cigars   Smokeless tobacco: Never  Vaping Use   Vaping Use: Never used  Substance and Sexual Activity   Alcohol use: Yes    Alcohol/week: 2.0 standard drinks of alcohol    Types: 2 Shots of liquor per week   Drug use: Not Currently    Types: Marijuana    Comment: stopped in 20s   Sexual activity: Yes  Other Topics Concern   Not on file  Social History Narrative   Not on file   Social Determinants of Health   Financial Resource Strain: Not on file  Food Insecurity: Not on file  Transportation Needs: Not on file  Physical Activity: Not on file  Stress: Not on file  Social Connections: Not on file  Intimate Partner Violence: Not on file    Family History  Problem Relation Age of Onset   Colon cancer Neg Hx    Colon polyps Neg Hx    Esophageal cancer Neg Hx    Stomach cancer Neg Hx    Rectal cancer Neg Hx      Review of Systems  Constitutional: Negative.  Negative for chills and fever.  HENT: Negative.  Negative for congestion and sore throat.   Respiratory: Negative.  Negative for cough and shortness of breath.   Cardiovascular: Negative.  Negative for chest pain and palpitations.  Gastrointestinal:  Negative for abdominal pain, diarrhea, nausea and vomiting.  Musculoskeletal:  Positive for joint pain (Left shoulder pain).  Skin: Negative.  Negative for rash.  Neurological: Negative.  Negative for dizziness and headaches.  All other systems reviewed and are negative.   Vitals:   05/09/22 1045 05/09/22 1057  BP: 138/88 130/80  Temp: 98.2 F (36.8 C)     Physical Exam Vitals reviewed.  Constitutional:      Appearance: Normal appearance.  HENT:     Head: Normocephalic.     Mouth/Throat:     Mouth: Mucous membranes are moist.     Pharynx: Oropharynx is clear.  Eyes:      Extraocular Movements: Extraocular movements intact.     Pupils: Pupils are equal, round, and reactive to light.  Cardiovascular:     Rate and Rhythm: Normal rate and regular rhythm.     Pulses: Normal pulses.     Heart sounds: Normal heart sounds.  Pulmonary:     Effort: Pulmonary effort is normal.     Breath sounds: Normal breath sounds.  Musculoskeletal:     Cervical back: No tenderness.  Lymphadenopathy:     Cervical: No cervical adenopathy.  Skin:    General: Skin is warm and dry.  Neurological:     General: No focal deficit present.     Mental Status: He is alert and oriented to person, place, and time.     Sensory: No sensory deficit.     Motor: No weakness.     Coordination: Coordination normal.     Gait: Gait normal.  Psychiatric:        Mood and Affect: Mood normal.        Behavior: Behavior normal.      ASSESSMENT & PLAN: A total of 44 minutes was spent with the patient and counseling/coordination of care regarding preparing for this visit, review of most recent office visit notes, review of multiple chronic medical conditions under management, review of all medications, cardiovascular risks associated with hypertension, education on nutrition, smoking cessation advised, review of most recent blood work results, prognosis, documentation and need for follow-up.  Problem List Items Addressed This Visit       Cardiovascular and Mediastinum   Essential hypertension - Primary    Well-controlled hypertension Continue amlodipine 5 mg daily Cardiovascular risks associated with hypertension discussed Diet and nutrition discussed Dietary approaches to stop hypertension discussed Benefits of exercise discussed Smoking cessation advice given Advised to monitor blood pressure readings at home daily for the next several weeks and keep a log.  Advised to contact the office if numbers persistently abnormal.      Relevant Orders   CBC with Differential/Platelet    Comprehensive metabolic panel   Lipid panel     Digestive   Gastroesophageal reflux disease    Stable and well-controlled Takes pantoprazole 20 mg as needed        Other   Current smoker    Cardiovascular and cancer risks associated with smoking discussed Smoking cessation advice given      Dyslipidemia    Chronic stable condition Continue rosuvastatin 20 mg daily Diet and nutrition discussed      Relevant Orders   CBC with Differential/Platelet   Comprehensive metabolic panel   Lipid panel   Other Visit Diagnoses  Screening for colon cancer       Relevant Orders   Ambulatory referral to Gastroenterology      Patient Instructions  Hypertension, Adult High blood pressure (hypertension) is when the force of blood pumping through the arteries is too strong. The arteries are the blood vessels that carry blood from the heart throughout the body. Hypertension forces the heart to work harder to pump blood and may cause arteries to become narrow or stiff. Untreated or uncontrolled hypertension can lead to a heart attack, heart failure, a stroke, kidney disease, and other problems. A blood pressure reading consists of a higher number over a lower number. Ideally, your blood pressure should be below 120/80. The first ("top") number is called the systolic pressure. It is a measure of the pressure in your arteries as your heart beats. The second ("bottom") number is called the diastolic pressure. It is a measure of the pressure in your arteries as the heart relaxes. What are the causes? The exact cause of this condition is not known. There are some conditions that result in high blood pressure. What increases the risk? Certain factors may make you more likely to develop high blood pressure. Some of these risk factors are under your control, including: Smoking. Not getting enough exercise or physical activity. Being overweight. Having too much fat, sugar, calories, or salt  (sodium) in your diet. Drinking too much alcohol. Other risk factors include: Having a personal history of heart disease, diabetes, high cholesterol, or kidney disease. Stress. Having a family history of high blood pressure and high cholesterol. Having obstructive sleep apnea. Age. The risk increases with age. What are the signs or symptoms? High blood pressure may not cause symptoms. Very high blood pressure (hypertensive crisis) may cause: Headache. Fast or irregular heartbeats (palpitations). Shortness of breath. Nosebleed. Nausea and vomiting. Vision changes. Severe chest pain, dizziness, and seizures. How is this diagnosed? This condition is diagnosed by measuring your blood pressure while you are seated, with your arm resting on a flat surface, your legs uncrossed, and your feet flat on the floor. The cuff of the blood pressure monitor will be placed directly against the skin of your upper arm at the level of your heart. Blood pressure should be measured at least twice using the same arm. Certain conditions can cause a difference in blood pressure between your right and left arms. If you have a high blood pressure reading during one visit or you have normal blood pressure with other risk factors, you may be asked to: Return on a different day to have your blood pressure checked again. Monitor your blood pressure at home for 1 week or longer. If you are diagnosed with hypertension, you may have other blood or imaging tests to help your health care provider understand your overall risk for other conditions. How is this treated? This condition is treated by making healthy lifestyle changes, such as eating healthy foods, exercising more, and reducing your alcohol intake. You may be referred for counseling on a healthy diet and physical activity. Your health care provider may prescribe medicine if lifestyle changes are not enough to get your blood pressure under control and if: Your  systolic blood pressure is above 130. Your diastolic blood pressure is above 80. Your personal target blood pressure may vary depending on your medical conditions, your age, and other factors. Follow these instructions at home: Eating and drinking  Eat a diet that is high in fiber and potassium, and low in sodium, added  sugar, and fat. An example of this eating plan is called the DASH diet. DASH stands for Dietary Approaches to Stop Hypertension. To eat this way: Eat plenty of fresh fruits and vegetables. Try to fill one half of your plate at each meal with fruits and vegetables. Eat whole grains, such as whole-wheat pasta, brown rice, or whole-grain bread. Fill about one fourth of your plate with whole grains. Eat or drink low-fat dairy products, such as skim milk or low-fat yogurt. Avoid fatty cuts of meat, processed or cured meats, and poultry with skin. Fill about one fourth of your plate with lean proteins, such as fish, chicken without skin, beans, eggs, or tofu. Avoid pre-made and processed foods. These tend to be higher in sodium, added sugar, and fat. Reduce your daily sodium intake. Many people with hypertension should eat less than 1,500 mg of sodium a day. Do not drink alcohol if: Your health care provider tells you not to drink. You are pregnant, may be pregnant, or are planning to become pregnant. If you drink alcohol: Limit how much you have to: 0-1 drink a day for women. 0-2 drinks a day for men. Know how much alcohol is in your drink. In the [place] one drink equals one 12 oz bottle of beer (355 mL), one 5 oz glass of wine (148 mL), or one 1 oz glass of hard liquor (44 mL). Lifestyle  Work with your health care provider to maintain a healthy body weight or to lose weight. Ask what an ideal weight is for you. Get at least 30 minutes of exercise that causes your heart to beat faster (aerobic exercise) most days of the week. Activities may include walking, swimming, or  biking. Include exercise to strengthen your muscles (resistance exercise), such as Pilates or lifting weights, as part of your weekly exercise routine. Try to do these types of exercises for 30 minutes at least 3 days a week. Do not use any products that contain nicotine or tobacco. These products include cigarettes, chewing tobacco, and vaping devices, such as e-cigarettes. If you need help quitting, ask your health care provider. Monitor your blood pressure at home as told by your health care provider. Keep all follow-up visits. This is important. Medicines Take over-the-counter and prescription medicines only as told by your health care provider. Follow directions carefully. Blood pressure medicines must be taken as prescribed. Do not skip doses of blood pressure medicine. Doing this puts you at risk for problems and can make the medicine less effective. Ask your health care provider about side effects or reactions to medicines that you should watch for. Contact a health care provider if you: Think you are having a reaction to a medicine you are taking. Have headaches that keep coming back (recurring). Feel dizzy. Have swelling in your ankles. Have trouble with your vision. Get help right away if you: Develop a severe headache or confusion. Have unusual weakness or numbness. Feel faint. Have severe pain in your chest or abdomen. Vomit repeatedly. Have trouble breathing. These symptoms may be an emergency. Get help right away. Call 911. Do not wait to see if the symptoms will go away. Do not drive yourself to the hospital. Summary Hypertension is when the force of blood pumping through your arteries is too strong. If this condition is not controlled, it may put you at risk for serious complications. Your personal target blood pressure may vary depending on your medical conditions, your age, and other factors. For most people, a normal  blood pressure is less than 120/80. Hypertension is  treated with lifestyle changes, medicines, or a combination of both. Lifestyle changes include losing weight, eating a healthy, low-sodium diet, exercising more, and limiting alcohol. This information is not intended to replace advice given to you by your health care provider. Make sure you discuss any questions you have with your health care provider. Document Revised: 12/06/2020 Document Reviewed: 12/06/2020 Elsevier Patient Education  [place], MD [facility]

## 2022-05-09 NOTE — Assessment & Plan Note (Signed)
Chronic stable condition Continue rosuvastatin 20 mg daily Diet and nutrition discussed

## 2022-05-10 ENCOUNTER — Ambulatory Visit: Payer: Medicare Other

## 2022-05-10 ENCOUNTER — Telehealth: Payer: Self-pay

## 2022-05-10 NOTE — Telephone Encounter (Signed)
Called patient lvm to return call, to complete AWV at [phone].  If no return call within 15 minutes, patient may reschedule for the next available appointment with [facility] or [facility].  Cortavious Nix N. Nikole Swartzentruber, LPN. [facility] AWV Team Direct Dial: [phone]  

## 2022-06-04 ENCOUNTER — Ambulatory Visit: Payer: Medicare Other | Admitting: Family Medicine

## 2022-06-04 NOTE — Progress Notes (Deleted)
   Rubin Payor, PhD, LAT, ATC acting as a scribe for Randall Graham, MD.  Randall Velasquez is a 67 y.o. male who presents to [facility] at [facility] today for cont'd L shoulder pain. Pt was last seen by Dr. Denyse Amass on 04/23/22  and was given a L subacromial steroid injection and was prescribed lidocaine patches. Today, pt reports ***  Dx imaging: 04/23/22 L shoulder XR  Pertinent review of systems: ***  Relevant historical information: ***   Exam:  There were no vitals taken for this visit. General: Well Developed, well nourished, and in no acute distress.   MSK: ***    Lab and Radiology Results No results found for this or any previous visit (from the past 72 hour(s)). No results found.     Assessment and Plan: 67 y.o. male with ***   PDMP not reviewed this encounter. No orders of the defined types were placed in this encounter.  No orders of the defined types were placed in this encounter.    Discussed warning signs or symptoms. Please see discharge instructions. Patient expresses understanding.   ***

## 2022-06-11 ENCOUNTER — Ambulatory Visit (INDEPENDENT_AMBULATORY_CARE_PROVIDER_SITE_OTHER): Payer: Medicare Other | Admitting: Family Medicine

## 2022-06-11 ENCOUNTER — Other Ambulatory Visit: Payer: Self-pay

## 2022-06-11 VITALS — BP 130/76 | HR 66 | Ht 71.0 in | Wt 165.0 lb

## 2022-06-11 DIAGNOSIS — M25512 Pain in left shoulder: Secondary | ICD-10-CM | POA: Diagnosis not present

## 2022-06-11 DIAGNOSIS — G8929 Other chronic pain: Secondary | ICD-10-CM

## 2022-06-11 NOTE — Progress Notes (Unsigned)
   Rubin Payor, PhD, LAT, ATC acting as a scribe for [name], MD.  Randall Velasquez is a 67 y.o. male who presents to [facility] at [facility] today for cont'd L shoulder pain. Pt was last seen by Dr. Denyse Amass on 04/23/22  and was given a L subacromial steroid injection and was prescribed lidocaine patches. Today, pt reports prior steroid injection helped a little bit. Pt locates pain all over his L shoulder.  Aggravates: aBd, flex  He also notes cramping in both of his legs.   Dx imaging: 04/23/22 L shoulder XR   Pertinent review of systems: ***  Relevant historical information: ***   Exam:  There were no vitals taken for this visit. General: Well Developed, well nourished, and in no acute distress.   MSK: ***    Lab and Radiology Results No results found for this or any previous visit (from the past 72 hour(s)). No results found.     Assessment and Plan: 67 y.o. male with ***   PDMP not reviewed this encounter. No orders of the defined types were placed in this encounter.  No orders of the defined types were placed in this encounter.    Discussed warning signs or symptoms. Please see discharge instructions. Patient expresses understanding.   ***

## 2022-06-11 NOTE — Patient Instructions (Signed)
Thank you for coming in today.   Plan for MRI.   Recheck following the MRI.   Let me know if you have a problem.

## 2022-07-04 ENCOUNTER — Ambulatory Visit (AMBULATORY_SURGERY_CENTER): Payer: Medicare Other

## 2022-07-04 VITALS — Ht 71.0 in | Wt 157.0 lb

## 2022-07-04 DIAGNOSIS — Z1211 Encounter for screening for malignant neoplasm of colon: Secondary | ICD-10-CM

## 2022-07-04 MED ORDER — NA SULFATE-K SULFATE-MG SULF 17.5-3.13-1.6 GM/177ML PO SOLN: 1.0000 | Freq: Once | ORAL | 0 refills | Status: AC

## 2022-07-04 NOTE — Progress Notes (Signed)

## 2022-07-11 ENCOUNTER — Inpatient Hospital Stay: Admission: RE | Admit: 2022-07-11 | Payer: Medicare Other | Source: Ambulatory Visit

## 2022-08-01 ENCOUNTER — Ambulatory Visit (AMBULATORY_SURGERY_CENTER): Payer: Medicare Other | Admitting: Gastroenterology

## 2022-08-01 ENCOUNTER — Encounter: Payer: Self-pay | Admitting: Gastroenterology

## 2022-08-01 VITALS — BP 178/106 | HR 62 | Temp 98.9°F | Resp 17 | Ht 71.0 in | Wt 157.0 lb

## 2022-08-01 DIAGNOSIS — D125 Benign neoplasm of sigmoid colon: Secondary | ICD-10-CM | POA: Diagnosis not present

## 2022-08-01 DIAGNOSIS — D124 Benign neoplasm of descending colon: Secondary | ICD-10-CM

## 2022-08-01 DIAGNOSIS — D120 Benign neoplasm of cecum: Secondary | ICD-10-CM | POA: Diagnosis not present

## 2022-08-01 DIAGNOSIS — Z1211 Encounter for screening for malignant neoplasm of colon: Secondary | ICD-10-CM

## 2022-08-01 DIAGNOSIS — D123 Benign neoplasm of transverse colon: Secondary | ICD-10-CM | POA: Diagnosis not present

## 2022-08-01 MED ORDER — SODIUM CHLORIDE 0.9 % IV SOLN: 500.0000 mL | Freq: Once | INTRAVENOUS | Status: DC

## 2022-08-01 NOTE — Op Note (Signed)
[facility] Patient Name: Randall Velasquez Procedure Date: 08/01/2022 10:44 AM MRN: [id number] Endoscopist: Lorin Picket E. Tomasa Rand , MD, [id number] Age: 67 Referring MD:  Date of Birth: 04/16/1955 Gender: Male Account #: [id number] Procedure:                Colonoscopy Indications:              Screening for colorectal malignant neoplasm, This                            is the patient's first colonoscopy Medicines:                Monitored Anesthesia Care Procedure:                Pre-Anesthesia Assessment:                           - Prior to the procedure, a History and Physical                            was performed, and patient medications and                            allergies were reviewed. The patient's tolerance of                            previous anesthesia was also reviewed. The risks                            and benefits of the procedure and the sedation                            options and risks were discussed with the patient.                            All questions were answered, and informed consent                            was obtained. Prior Anticoagulants: The patient has                            taken no anticoagulant or antiplatelet agents. ASA                            Grade Assessment: II - A patient with mild systemic                            disease. After reviewing the risks and benefits,                            the patient was deemed in satisfactory condition to                            undergo the procedure.  After obtaining informed consent, the colonoscope                            was passed under direct vision. Throughout the                            procedure, the patient's blood pressure, pulse, and                            oxygen saturations were monitored continuously. The                            Olympus CF-HQ190L ([id number]) Colonoscope was                            introduced through  the anus and advanced to the the                            cecum, identified by appendiceal orifice and                            ileocecal valve. The colonoscopy was somewhat                            difficult due to a tortuous colon. Successful                            completion of the procedure was aided by using                            manual pressure. The patient tolerated the                            procedure well. The quality of the bowel                            preparation was adequate. The ileocecal valve,                            appendiceal orifice, and rectum were photographed.                            The bowel preparation used was SUPREP via split                            dose instruction. Scope In: 11:01:09 AM Scope Out: 11:25:08 AM Scope Withdrawal Time: 0 hours 17 minutes 30 seconds  Total Procedure Duration: 0 hours 23 minutes 59 seconds  Findings:                 The perianal and digital rectal examinations were                            normal. Pertinent negatives include normal  sphincter tone and no palpable rectal lesions.                           A 5 mm polyp was found in the cecum. The polyp was                            sessile. The polyp was removed with a cold snare.                            Resection and retrieval were complete. Estimated                            blood loss was minimal.                           Three sessile polyps were found in the transverse                            colon. The polyps were 4 to 6 mm in size. These                            polyps were removed with a cold snare. Resection                            and retrieval were complete. Estimated blood loss                            was minimal.                           A 3 mm polyp was found in the descending colon. The                            polyp was sessile. The polyp was removed with a                            cold  snare. Resection and retrieval were complete.                            Estimated blood loss was minimal.                           A 3 mm polyp was found in the sigmoid colon. The                            polyp was sessile. The polyp was removed with a                            cold snare. Resection and retrieval were complete.                            Estimated blood loss was minimal.  Multiple medium-mouthed and small-mouthed                            diverticula were found in the sigmoid colon,                            descending colon, transverse colon, ascending colon                            and cecum.                           The exam was otherwise normal throughout the                            examined colon.                           The retroflexed view of the distal rectum and anal                            verge was normal and showed no anal or rectal                            abnormalities. Complications:            No immediate complications. Estimated Blood Loss:     Estimated blood loss was minimal. Impression:               - One 5 mm polyp in the cecum, removed with a cold                            snare. Resected and retrieved.                           - Three 4 to 6 mm polyps in the transverse colon,                            removed with a cold snare. Resected and retrieved.                           - One 3 mm polyp in the descending colon, removed                            with a cold snare. Resected and retrieved.                           - One 3 mm polyp in the sigmoid colon, removed with                            a cold snare. Resected and retrieved.                           - Moderate diverticulosis in the sigmoid colon, in  the descending colon, in the transverse colon, in                            the ascending colon and in the cecum.                           - The distal rectum and  anal verge are normal on                            retroflexion view. Recommendation:           - Patient has a contact number available for                            emergencies. The signs and symptoms of potential                            delayed complications were discussed with the                            patient. Return to normal activities tomorrow.                            Written discharge instructions were provided to the                            patient.                           - Resume previous diet.                           - Continue present medications.                           - Await pathology results.                           - Repeat colonoscopy (date not yet determined) for                            surveillance based on pathology results.                           - Recommend high fiber diet/daily fiber                            supplementation to reduce risk of diverticular                            complications. Katha Kuehne E. Tomasa Rand, MD 08/01/2022 11:32:56 AM This report has been signed electronically.

## 2022-08-01 NOTE — Patient Instructions (Signed)
Discharge instructions given. Handouts on polyps and diverticulosis. Resume previous medications. YOU HAD AN ENDOSCOPIC PROCEDURE TODAY AT THE [facility]:   Refer to the procedure report that was given to you for any specific questions about what was found during the examination.  If the procedure report does not answer your questions, please call your gastroenterologist to clarify.  If you requested that your care partner not be given the details of your procedure findings, then the procedure report has been included in a sealed envelope for you to review at your convenience later.  YOU SHOULD EXPECT: Some feelings of bloating in the abdomen. Passage of more gas than usual.  Walking can help get rid of the air that was put into your GI tract during the procedure and reduce the bloating. If you had a lower endoscopy (such as a colonoscopy or flexible sigmoidoscopy) you may notice spotting of blood in your stool or on the toilet paper. If you underwent a bowel prep for your procedure, you may not have a normal bowel movement for a few days.  Please Note:  You might notice some irritation and congestion in your nose or some drainage.  This is from the oxygen used during your procedure.  There is no need for concern and it should clear up in a day or so.  SYMPTOMS TO REPORT IMMEDIATELY:  Following lower endoscopy (colonoscopy or flexible sigmoidoscopy):  Excessive amounts of blood in the stool  Significant tenderness or worsening of abdominal pains  Swelling of the abdomen that is new, acute  Fever of 100F or higher   For urgent or emergent issues, a gastroenterologist can be reached at any hour by calling [phone]. Do not use MyChart messaging for urgent concerns.    DIET:  We do recommend a small meal at first, but then you may proceed to your regular diet.  Drink plenty of fluids but you should avoid alcoholic beverages for 24 hours.  ACTIVITY:  You should plan to take it  easy for the rest of today and you should NOT DRIVE or use heavy machinery until tomorrow (because of the sedation medicines used during the test).    FOLLOW UP: Our staff will call the number listed on your records the next business day following your procedure.  We will call around 7:15- 8:00 am to check on you and address any questions or concerns that you may have regarding the information given to you following your procedure. If we do not reach you, we will leave a message.     If any biopsies were taken you will be contacted by phone or by letter within the next 1-3 weeks.  Please call us at [phone] if you have not heard about the biopsies in 3 weeks.    SIGNATURES/CONFIDENTIALITY: You and/or your care partner have signed paperwork which will be entered into your electronic medical record.  These signatures attest to the fact that that the information above on your After Visit Summary has been reviewed and is understood.  Full responsibility of the confidentiality of this discharge information lies with you and/or your care-partner. 

## 2022-08-01 NOTE — Progress Notes (Signed)
Sedate, gd SR, tolerated procedure well, VSS, report to RN 

## 2022-08-01 NOTE — Progress Notes (Signed)
Called to room to assist during endoscopic procedure.  Patient ID and intended procedure confirmed with present staff. Received instructions for my participation in the procedure from the performing physician.  

## 2022-08-01 NOTE — Progress Notes (Signed)
[facility] History and Physical   Primary Care Physician:  Georgina Quint, MD   Reason for Procedure:   Colon cancer screening  Plan:    Colonoscopy     HPI: Randall Velasquez is a 67 y.o. male undergoing initial average risk screening colonoscopy.  He has no family history of colon cancer and no chronic GI symptoms.    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 Authorizing Provider  amLODipine (NORVASC) 5 MG tablet Take 1 tablet (5 mg total) by mouth daily. 02/08/22  Yes Sagardia, Eilleen Kempf, MD  rosuvastatin (CRESTOR) 20 MG tablet Take 20 mg by mouth daily. 03/11/22  Yes [provider]  lidocaine (HM LIDOCAINE PATCH) 4 % Place 1 patch onto the skin daily. Patient not taking: Reported on 06/11/2022 04/23/22   Rodolph Bong, MD  mometasone (ELOCON) 0.1 % cream APPLY TO AFFECTED AREA TOPICALLY EVERY DAY Patient not taking: Reported on 07/04/2022 07/31/21   de [place], Buren Kos, MD  pantoprazole (PROTONIX) 20 MG tablet Take 1 tablet (20 mg total) by mouth daily. Patient not taking: Reported on 07/04/2022 04/18/21   [place], Buren Kos, MD    Current Outpatient Medications  Medication Sig Dispense Refill   amLODipine (NORVASC) 5 MG tablet Take 1 tablet (5 mg total) by mouth daily. 90 tablet 3   rosuvastatin (CRESTOR) 20 MG tablet Take 20 mg by mouth daily.     lidocaine (HM LIDOCAINE PATCH) 4 % Place 1 patch onto the skin daily. (Patient not taking: Reported on 06/11/2022) 30 patch 12   mometasone (ELOCON) 0.1 % cream APPLY TO AFFECTED AREA TOPICALLY EVERY DAY (Patient not taking: Reported on 07/04/2022) 45 g 1   pantoprazole (PROTONIX) 20 MG  tablet Take 1 tablet (20 mg total) by mouth daily. (Patient not taking: Reported on 07/04/2022) 90 tablet 1   Current Facility-Administered Medications  Medication Dose Route Frequency Provider Last Rate Last Admin   0.9 %  sodium chloride infusion  500 mL Intravenous Once Jenel Lucks, MD        Allergies as of 08/01/2022   (No Known Allergies)    Family History  Problem Relation Age of Onset   Colon cancer Neg Hx    Colon polyps Neg Hx    Esophageal cancer Neg Hx    Stomach cancer Neg Hx    Rectal cancer Neg Hx     Social History   Socioeconomic History   Marital status: Single    Spouse name: Not on file   Number of children: Not on file   Years of education: Not on file   Highest education level: Not on file  Occupational History   Not on file  Tobacco Use   Smoking status: Every Day    Types: Cigars   Smokeless tobacco: Never  Vaping Use   Vaping Use: Never used  Substance and Sexual Activity   Alcohol use: Yes    Alcohol/week: 2.0 standard drinks of alcohol    Types: 2 Shots of liquor per week   Drug use: Not Currently    Types:  Marijuana    Comment: stopped in 70s   Sexual activity: Yes  Other Topics Concern   Not on file  Social History Narrative   Not on file   Social Determinants of Health   Financial Resource Strain: Not on file  Food Insecurity: Not on file  Transportation Needs: Not on file  Physical Activity: Not on file  Stress: Not on file  Social Connections: Not on file  Intimate Partner Violence: Not on file    Review of Systems:  All other review of systems negative except as mentioned in the HPI.  Physical Exam: Vital signs BP (!) 147/106   Pulse 75   Temp 98.9 F (37.2 C)   Ht 5\' 11"  (1.803 m)   Wt 157 lb (71.2 kg)   SpO2 98%   BMI 21.90 kg/m   General:   Alert,  Well-developed, well-nourished, pleasant and cooperative in NAD Airway:  Mallampati 1 Lungs:  Clear throughout to auscultation.   Heart:  Regular rate  and rhythm; no murmurs, clicks, rubs,  or gallops. Abdomen:  Soft, nontender and nondistended. Normal bowel sounds.   Neuro/Psych:  Normal mood and affect. A and O x 3   Nyeem Stoke E. Tomasa Rand, MD [facility]

## 2022-08-01 NOTE — Progress Notes (Signed)
Patient instructed to take blood pressure medication after he arrives home.

## 2022-08-02 ENCOUNTER — Telehealth: Payer: Self-pay | Admitting: *Deleted

## 2022-08-02 NOTE — Telephone Encounter (Signed)
  Follow up Call-     08/01/2022   10:23 AM  Call back number  Post procedure Call Back phone  # [phone]  Permission to leave phone message Yes     Patient questions:  Do you have a fever, pain , or abdominal swelling? No. Pain Score  0 *  Have you tolerated food without any problems? Yes.    Have you been able to return to your normal activities? Yes.    Do you have any questions about your discharge instructions: Diet   No. Medications  No. Follow up visit  No.  Do you have questions or concerns about your Care? No.  Actions: * If pain score is 4 or above: No action needed, pain <4.

## 2022-08-08 ENCOUNTER — Encounter: Payer: Self-pay | Admitting: Gastroenterology

## 2022-08-08 ENCOUNTER — Ambulatory Visit (INDEPENDENT_AMBULATORY_CARE_PROVIDER_SITE_OTHER): Payer: Medicare Other

## 2022-08-08 DIAGNOSIS — Z0000 Encounter for general adult medical examination without abnormal findings: Secondary | ICD-10-CM | POA: Diagnosis not present

## 2022-08-08 NOTE — Progress Notes (Signed)
Subjective:   Randall Velasquez is a 67 y.o. male who presents for an Initial Medicare Annual Wellness Visit.  Visit Complete: Virtual  I connected with  [name] on 08/08/22 by a audio enabled telemedicine application and verified that I am speaking with the correct person using two identifiers.  Patient Location: Home  Provider Location: Home Office  I discussed the limitations of evaluation and management by telemedicine. The patient expressed understanding and agreed to proceed.  Patient Medicare AWV questionnaire was completed by the patient on 08/08/2022; I have confirmed that all information answered by patient is correct and no changes since this date. Cardiac Risk Factors include: advanced age (>52men, >22 women);hypertension;male gender     Objective:    Today's Vitals   There is no height or weight on file to calculate BMI.     08/08/2022   11:16 AM  Advanced Directives  Does Patient Have a Medical Advance Directive? No  Would patient like information on creating a medical advance directive? No - Patient declined    Current Medications (verified) Outpatient Encounter Medications as of 08/08/2022  Medication Sig   amLODipine (NORVASC) 5 MG tablet Take 1 tablet (5 mg total) by mouth daily.   lidocaine (HM LIDOCAINE PATCH) 4 % Place 1 patch onto the skin daily.   mometasone (ELOCON) 0.1 % cream APPLY TO AFFECTED AREA TOPICALLY EVERY DAY   pantoprazole (PROTONIX) 20 MG tablet Take 1 tablet (20 mg total) by mouth daily.   rosuvastatin (CRESTOR) 20 MG tablet Take 20 mg by mouth daily.   No facility-administered encounter medications on file as of 08/08/2022.    Allergies (verified) Patient has no known allergies.   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   GERD (gastroesophageal reflux disease)    Hyperlipidemia    Hypertension    Past Surgical History:   Procedure Laterality Date   HERNIA REPAIR     as a child   Family History  Problem Relation Age of Onset   Colon cancer Neg Hx    Colon polyps Neg Hx    Esophageal cancer Neg Hx    Stomach cancer Neg Hx    Rectal cancer Neg Hx    Social History   Socioeconomic History   Marital status: Single    Spouse name: Not on file   Number of children: Not on file   Years of education: Not on file   Highest education level: Not on file  Occupational History   Not on file  Tobacco Use   Smoking status: Every Day    Types: Cigars   Smokeless tobacco: Never  Vaping Use   Vaping Use: Never used  Substance and Sexual Activity   Alcohol use: Yes    Alcohol/week: 2.0 standard drinks of alcohol    Types: 2 Shots of liquor per week   Drug use: Not Currently    Types: Marijuana    Comment: stopped in 20s   Sexual activity: Yes  Other Topics Concern   Not on file  Social History Narrative   Not on file   Social Determinants of Health   Financial Resource Strain: Low Risk  (08/08/2022)   Overall Financial Resource Strain (CARDIA)    Difficulty of Paying Living Expenses: Not very hard  Food Insecurity: No Food Insecurity (08/08/2022)   Hunger Vital Sign    Worried About Running  Out of Food in the Last Year: Never true    Ran Out of Food in the Last Year: Never true  Transportation Needs: No Transportation Needs (08/08/2022)   PRAPARE - Administrator, Civil Service (Medical): No    Lack of Transportation (Non-Medical): No  Physical Activity: Sufficiently Active (08/08/2022)   Exercise Vital Sign    Days of Exercise per Week: 5 days    Minutes of Exercise per Session: 60 min  Stress: No Stress Concern Present (08/08/2022)   [employer] of Occupational Health - Occupational Stress Questionnaire    Feeling of Stress : Not at all  Social Connections: Moderately Isolated (08/08/2022)   Social Connection and Isolation Panel [NHANES]    Frequency of Communication with  Friends and Family: More than three times a week    Frequency of Social Gatherings with Friends and Family: More than three times a week    Attends Religious Services: Never    Database administrator or Organizations: No    Attends Engineer, structural: Never    Marital Status: Living with partner    Tobacco Counseling Ready to quit: Not Answered Counseling given: Not Answered   Clinical Intake:  Pre-visit preparation completed: Yes  Pain : No/denies pain     Diabetes: No  How often do you need to have someone help you when you read instructions, pamphlets, or other written materials from your doctor or pharmacy?: 1 - Never What is the last grade level you completed in school?: 11th grade  Interpreter Needed?: No      Activities of Daily Living    08/08/2022   11:14 AM  In your present state of health, do you have any difficulty performing the following activities:  Hearing? 0  Vision? 0  Difficulty concentrating or making decisions? 0  Walking or climbing stairs? 0  Dressing or bathing? 0  Doing errands, shopping? 0  Preparing Food and eating ? N  Using the Toilet? N  In the past six months, have you accidently leaked urine? N  Do you have problems with loss of bowel control? N  Managing your Medications? N  Managing your Finances? N  Housekeeping or managing your Housekeeping? N    Patient Care Team: Georgina Quint, MD as PCP - General (Internal Medicine) Wyline Mood Alben Spittle, MD as PCP - Cardiology (Cardiology) Soundra Pilon, LCSW as Social Worker (Licensed Clinical Social Worker) Colletta Maryland, RN as [facility] Care Management  Indicate any recent Medical Services you may have received from other than [facility] providers in the past year (date may be approximate).     Assessment:   This is a routine wellness examination for Randall Velasquez.  Hearing/Vision screen No results found.  Dietary issues and exercise activities  discussed:     Goals Addressed             This Visit's Progress    just to live as long as i can        Depression Screen    08/08/2022   11:19 AM 05/09/2022   10:46 AM 02/08/2022   10:11 AM 06/29/2021    8:56 AM 06/07/2021    3:35 PM 05/30/2021    3:03 PM  PHQ 2/9 Scores  PHQ - 2 Score 0 0 0 0 0 0  PHQ- 9 Score 0       Exception Documentation     Medical reason     Fall Risk  08/08/2022   11:16 AM 05/09/2022   10:46 AM 02/08/2022   10:10 AM 06/29/2021    8:56 AM 05/30/2021    3:03 PM  Fall Risk   Falls in the past year? 0 0 0 0 0  Number falls in past yr: 0 0 0  0  Injury with Fall? 0 0 0  0  Risk for fall due to : No Fall Risks No Fall Risks No Fall Risks  No Fall Risks  Follow up Falls evaluation completed Falls evaluation completed Falls evaluation completed  Falls evaluation completed    MEDICARE RISK AT HOME:  Medicare Risk at Home - 08/08/22 1116     Any stairs in or around the home? No    If so, are there any without handrails? No    Home free of loose throw rugs in walkways, pet beds, electrical cords, etc? Yes    Adequate lighting in your home to reduce risk of falls? Yes    Life alert? No    Use of a cane, walker or w/c? No    Grab bars in the bathroom? No    Shower chair or bench in shower? No    Elevated toilet seat or a handicapped toilet? No             TIMED UP AND GO:  Was the test performed? No    Cognitive Function:        08/08/2022   11:20 AM  6CIT Screen  What Year? 0 points  What month? 0 points  What time? 0 points  Count back from 20 0 points  Months in reverse 4 points  Repeat phrase 2 points  Total Score 6 points    Immunizations Immunization History  Administered Date(s) Administered   Fluad Quad(high Dose 65+) 02/08/2022   Influenza,inj,quad, With Preservative 01/04/2020   PFIZER(Purple Top)SARS-COV-2 Vaccination 02/14/2020, 03/09/2020   PNEUMOCOCCAL CONJUGATE-20 06/29/2021   Tdap 01/04/2020    TDAP  status: Up to date  Flu Vaccine status: Up to date  Pneumococcal vaccine status: Up to date  Covid-19 vaccine status: Completed vaccines  Qualifies for Shingles Vaccine? Yes   Zostavax completed No   Shingrix Completed?: No.    Education has been provided regarding the importance of this vaccine. Patient has been advised to call insurance company to determine out of pocket expense if they have not yet received this vaccine. Advised may also receive vaccine at local pharmacy or Health Dept. Verbalized acceptance and understanding.  Screening Tests Health Maintenance  Topic Date Due   Hepatitis C Screening  Never done   Lung Cancer Screening  Never done   Zoster Vaccines- Shingrix (1 of 2) Never done   COVID-19 Vaccine (3 - 2023-24 season) 10/13/2021   INFLUENZA VACCINE  09/13/2022   Medicare Annual Wellness (AWV)  08/08/2023   DTaP/Tdap/Td (2 - Td or Tdap) 01/03/2030   Colonoscopy  07/31/2032   Pneumonia Vaccine 57+ Years old  Completed   HPV VACCINES  Aged Out    Health Maintenance  Health Maintenance Due  Topic Date Due   Hepatitis C Screening  Never done   Lung Cancer Screening  Never done   Zoster Vaccines- Shingrix (1 of 2) Never done   COVID-19 Vaccine (3 - 2023-24 season) 10/13/2021    Colorectal cancer screening: Type of screening: Colonoscopy. Completed 08/01/2022. Repeat every 10 years  Lung Cancer Screening: (Low Dose CT Chest recommended if Age 28-80 years, 20 pack-year currently smoking OR have quit  w/in 15years.) does qualify.   Lung Cancer Screening Referral: provider will place   Additional Screening:  Hepatitis C Screening: does qualify; Completed no  Vision Screening: Recommended annual ophthalmology exams for early detection of glaucoma and other disorders of the eye. Is the patient up to date with their annual eye exam?  No  Who is the provider or what is the name of the office in which the patient attends annual eye exams? na If pt is not  established with a provider, would they like to be referred to a provider to establish care? Yes .   Dental Screening: Recommended annual dental exams for proper oral hygiene  Diabetic Foot Exam:   Community Resource Referral / Chronic Care Management: CRR required this visit?  No   CCM required this visit?  No    Plan:     I have personally reviewed and noted the following in the patient's chart:   Medical and social history Use of alcohol, tobacco or illicit drugs  Current medications and supplements including opioid prescriptions. Patient is not currently taking opioid prescriptions. Functional ability and status Nutritional status Physical activity Advanced directives List of other physicians Hospitalizations, surgeries, and ER visits in previous 12 months Vitals Screenings to include cognitive, depression, and falls Referrals and appointments  In addition, I have reviewed and discussed with patient certain preventive protocols, quality metrics, and best practice recommendations. A written personalized care plan for preventive services as well as general preventive health recommendations were provided to patient.     Delana Meyer   08/08/2022   After Visit Summary: (MyChart) Due to this being a telephonic visit, the after visit summary with patients personalized plan was offered to patient via MyChart   Nurse Notes:

## 2022-08-08 NOTE — Patient Instructions (Signed)
Health Maintenance, Male Adopting a healthy lifestyle and getting preventive care are important in promoting health and wellness. Ask your health care provider about: The right schedule for you to have regular tests and exams. Things you can do on your own to prevent diseases and keep yourself healthy. What should I know about diet, weight, and exercise? Eat a healthy diet  Eat a diet that includes plenty of vegetables, fruits, low-fat dairy products, and lean protein. Do not eat a lot of foods that are high in solid fats, added sugars, or sodium. Maintain a healthy weight Body mass index (BMI) is a measurement that can be used to identify possible weight problems. It estimates body fat based on height and weight. Your health care provider can help determine your BMI and help you achieve or maintain a healthy weight. Get regular exercise Get regular exercise. This is one of the most important things you can do for your health. Most adults should: Exercise for at least 150 minutes each week. The exercise should increase your heart rate and make you sweat (moderate-intensity exercise). Do strengthening exercises at least twice a week. This is in addition to the moderate-intensity exercise. Spend less time sitting. Even light physical activity can be beneficial. Watch cholesterol and blood lipids Have your blood tested for lipids and cholesterol at 67 years of age, then have this test every 5 years. You may need to have your cholesterol levels checked more often if: Your lipid or cholesterol levels are high. You are older than 67 years of age. You are at high risk for heart disease. What should I know about cancer screening? Many types of cancers can be detected early and may often be prevented. Depending on your health history and family history, you may need to have cancer screening at various ages. This may include screening for: Colorectal cancer. Prostate cancer. Skin cancer. Lung  cancer. What should I know about heart disease, diabetes, and high blood pressure? Blood pressure and heart disease High blood pressure causes heart disease and increases the risk of stroke. This is more likely to develop in people who have high blood pressure readings or are overweight. Talk with your health care provider about your target blood pressure readings. Have your blood pressure checked: Every 3-5 years if you are 18-39 years of age. Every year if you are 40 years old or older. If you are between the ages of 65 and 75 and are a current or former smoker, ask your health care provider if you should have a one-time screening for abdominal aortic aneurysm (AAA). Diabetes Have regular diabetes screenings. This checks your fasting blood sugar level. Have the screening done: Once every three years after age 45 if you are at a normal weight and have a low risk for diabetes. More often and at a younger age if you are overweight or have a high risk for diabetes. What should I know about preventing infection? Hepatitis B If you have a higher risk for hepatitis B, you should be screened for this virus. Talk with your health care provider to find out if you are at risk for hepatitis B infection. Hepatitis C Blood testing is recommended for: Everyone born from 1945 through 1965. Anyone with known risk factors for hepatitis C. Sexually transmitted infections (STIs) You should be screened each year for STIs, including gonorrhea and chlamydia, if: You are sexually active and are younger than 67 years of age. You are older than 67 years of age and your   health care provider tells you that you are at risk for this type of infection. Your sexual activity has changed since you were last screened, and you are at increased risk for chlamydia or gonorrhea. Ask your health care provider if you are at risk. Ask your health care provider about whether you are at high risk for HIV. Your health care provider  may recommend a prescription medicine to help prevent HIV infection. If you choose to take medicine to prevent HIV, you should first get tested for HIV. You should then be tested every 3 months for as long as you are taking the medicine. Follow these instructions at home: Alcohol use Do not drink alcohol if your health care provider tells you not to drink. If you drink alcohol: Limit how much you have to 0-2 drinks a day. Know how much alcohol is in your drink. In the [place] one drink equals one 12 oz bottle of beer (355 mL), one 5 oz glass of wine (148 mL), or one 1 oz glass of hard liquor (44 mL). Lifestyle Do not use any products that contain nicotine or tobacco. These products include cigarettes, chewing tobacco, and vaping devices, such as e-cigarettes. If you need help quitting, ask your health care provider. Do not use street drugs. Do not share needles. Ask your health care provider for help if you need support or information about quitting drugs. General instructions Schedule regular health, dental, and eye exams. Stay current with your vaccines. Tell your health care provider if: You often feel depressed. You have ever been abused or do not feel safe at home. Summary Adopting a healthy lifestyle and getting preventive care are important in promoting health and wellness. Follow your health care provider's instructions about healthy diet, exercising, and getting tested or screened for diseases. Follow your health care provider's instructions on monitoring your cholesterol and blood pressure. This information is not intended to replace advice given to you by your health care provider. Make sure you discuss any questions you have with your health care provider. Document Revised: 06/20/2020 Document Reviewed: 06/20/2020 Elsevier Patient Education  2024 Elsevier Inc.  

## 2022-09-06 ENCOUNTER — Other Ambulatory Visit: Payer: Self-pay | Admitting: Emergency Medicine

## 2022-09-06 DIAGNOSIS — E785 Hyperlipidemia, unspecified: Secondary | ICD-10-CM

## 2022-10-10 ENCOUNTER — Ambulatory Visit (INDEPENDENT_AMBULATORY_CARE_PROVIDER_SITE_OTHER): Payer: Medicare Other | Admitting: Emergency Medicine

## 2022-10-10 ENCOUNTER — Encounter: Payer: Self-pay | Admitting: Emergency Medicine

## 2022-10-10 VITALS — BP 116/68 | HR 62 | Temp 98.6°F | Ht 71.0 in | Wt 158.4 lb

## 2022-10-10 DIAGNOSIS — Z23 Encounter for immunization: Secondary | ICD-10-CM | POA: Diagnosis not present

## 2022-10-10 DIAGNOSIS — I10 Essential (primary) hypertension: Secondary | ICD-10-CM | POA: Diagnosis not present

## 2022-10-10 DIAGNOSIS — E785 Hyperlipidemia, unspecified: Secondary | ICD-10-CM

## 2022-10-10 DIAGNOSIS — K219 Gastro-esophageal reflux disease without esophagitis: Secondary | ICD-10-CM

## 2022-10-10 DIAGNOSIS — F17200 Nicotine dependence, unspecified, uncomplicated: Secondary | ICD-10-CM

## 2022-10-10 DIAGNOSIS — M2550 Pain in unspecified joint: Secondary | ICD-10-CM

## 2022-10-10 MED ORDER — MELOXICAM 15 MG PO TABS
15.0000 mg | ORAL_TABLET | Freq: Every day | ORAL | 0 refills | Status: DC
Start: 2022-10-10 — End: 2022-11-07

## 2022-10-10 NOTE — Assessment & Plan Note (Signed)
Active and affecting quality of life Recommend meloxicam 15 mg daily as needed

## 2022-10-10 NOTE — Assessment & Plan Note (Signed)
BP Readings from Last 3 Encounters:  10/10/22 116/68  08/01/22 (!) 178/106  06/11/22 130/76  Well-controlled hypertension Continue amlodipine 5 mg daily Cardiovascular risks associated with hypertension discussed Diet and nutrition discussed

## 2022-10-10 NOTE — Assessment & Plan Note (Signed)
Cardiovascular/cancer risks associated with smoking discussed. Smoking cessation advice given. 

## 2022-10-10 NOTE — Progress Notes (Signed)
Randall Velasquez 67 y.o.   Chief Complaint  Patient presents with   Medical Management of Chronic Issues    F/u appt, patient states his shoulder, leg and neck is painful, feels like it is " cracking "     HISTORY OF PRESENT ILLNESS: This is a 67 y.o. male here for 27-month follow-up of chronic medical conditions Also complaining of pain to neck, left shoulder, and left knee. No other complaints or medical concerns today.  HPI   Prior to Admission medications   Medication Sig Start Date End Date Taking? Authorizing Provider  amLODipine (NORVASC) 5 MG tablet Take 1 tablet (5 mg total) by mouth daily. 02/08/22  Yes Nihaal Friesen, Eilleen Kempf, MD  lidocaine (HM LIDOCAINE PATCH) 4 % Place 1 patch onto the skin daily. 04/23/22  Yes Rodolph Bong, MD  mometasone (ELOCON) 0.1 % cream APPLY TO AFFECTED AREA TOPICALLY EVERY DAY 07/31/21  Yes de [place], Raymond J, MD  rosuvastatin (CRESTOR) 20 MG tablet Take 1 tablet (20 mg total) by mouth daily. Follow-up appt due in Sept must see provider for future refills 09/06/22  Yes Navah Grondin, Eilleen Kempf, MD  pantoprazole (PROTONIX) 20 MG tablet Take 1 tablet (20 mg total) by mouth daily. 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   GERD (gastroesophageal reflux disease)    Hyperlipidemia    Hypertension     Past Surgical History:  Procedure Laterality Date   HERNIA REPAIR     as a child    Social  History   Socioeconomic History   Marital status: Single    Spouse name: Not on file   Number of children: Not on file   Years of education: Not on file   Highest education level: Not on file  Occupational History   Not on file  Tobacco Use   Smoking status: Every Day    Types: Cigars   Smokeless tobacco: Never  Vaping Use   Vaping status: Never Used  Substance and Sexual Activity   Alcohol use: Yes    Alcohol/week: 2.0 standard drinks of alcohol    Types: 2 Shots of liquor per week   Drug use: Not Currently    Types: Marijuana    Comment: stopped in 20s   Sexual activity: Yes  Other Topics Concern   Not on file  Social History Narrative   Not on file   Social Determinants of Health   Financial Resource Strain: Low Risk  (08/08/2022)   Overall Financial Resource Strain (CARDIA)    Difficulty of Paying Living Expenses: Not very hard  Food Insecurity: No Food Insecurity (08/08/2022)   Hunger Vital Sign    Worried About Running Out of Food in the Last Year: Never true    Ran Out of Food in the Last Year: Never true  Transportation Needs: No Transportation Needs (08/08/2022)   PRAPARE - Administrator, Civil Service (Medical): No  Lack of Transportation (Non-Medical): No  Physical Activity: Sufficiently Active (08/08/2022)   Exercise Vital Sign    Days of Exercise per Week: 5 days    Minutes of Exercise per Session: 60 min  Stress: No Stress Concern Present (08/08/2022)   [employer] of Occupational Health - Occupational Stress Questionnaire    Feeling of Stress : Not at all  Social Connections: Moderately Isolated (08/08/2022)   Social Connection and Isolation Panel [NHANES]    Frequency of Communication with Friends and Family: More than three times a week    Frequency of Social Gatherings with Friends and Family: More than three times a week    Attends Religious Services: Never    Database administrator or Organizations: No    Attends Tax inspector Meetings: Never    Marital Status: Living with partner  Intimate Partner Violence: Not At Risk (08/08/2022)   Humiliation, Afraid, Rape, and Kick questionnaire    Fear of Current or Ex-Partner: No    Emotionally Abused: No    Physically Abused: No    Sexually Abused: No    Family History  Problem Relation Age of Onset   Colon cancer Neg Hx    Colon polyps Neg Hx    Esophageal cancer Neg Hx    Stomach cancer Neg Hx    Rectal cancer Neg Hx      Review of Systems  Constitutional: Negative.  Negative for chills and fever.  HENT: Negative.  Negative for congestion and sore throat.   Respiratory: Negative.  Negative for cough and shortness of breath.   Cardiovascular: Negative.  Negative for chest pain and palpitations.  Gastrointestinal:  Negative for abdominal pain, nausea and vomiting.  Genitourinary: Negative.  Negative for hematuria.  Musculoskeletal:  Positive for joint pain and neck pain.  Neurological:  Negative for dizziness and headaches.    Vitals:   10/10/22 1543  BP: 116/68  Pulse: 62  Temp: 98.6 F (37 C)  SpO2: 95%    Physical Exam Vitals reviewed.  Constitutional:      Appearance: Normal appearance.  HENT:     Head: Normocephalic.     Mouth/Throat:     Mouth: Mucous membranes are moist.     Pharynx: Oropharynx is clear.  Eyes:     Extraocular Movements: Extraocular movements intact.     Conjunctiva/sclera: Conjunctivae normal.     Pupils: Pupils are equal, round, and reactive to light.  Cardiovascular:     Rate and Rhythm: Normal rate and regular rhythm.     Pulses: Normal pulses.     Heart sounds: Normal heart sounds.  Pulmonary:     Effort: Pulmonary effort is normal.     Breath sounds: Normal breath sounds.  Abdominal:     Palpations: Abdomen is soft.     Tenderness: There is no abdominal tenderness.  Musculoskeletal:     Cervical back: No tenderness.     Right lower leg: No edema.     Left lower leg: No edema.   Lymphadenopathy:     Cervical: No cervical adenopathy.  Skin:    General: Skin is warm and dry.  Neurological:     General: No focal deficit present.     Mental Status: He is alert and oriented to person, place, and time.  Psychiatric:        Mood and Affect: Mood normal.        Behavior: Behavior normal.      ASSESSMENT & PLAN: A  total of 47 minutes was spent with the patient and counseling/coordination of care regarding preparing for this visit, review of most recent office visit notes, review of most recent blood work results, cardiovascular risks associated with hypertension and dyslipidemia, review of multiple chronic medical conditions under management, review of all medications, education on nutrition, smoking cessation advised, review of health maintenance items, prognosis, documentation, need for follow-up.  Problem List Items Addressed This Visit       Cardiovascular and Mediastinum   Essential hypertension - Primary    BP Readings from Last 3 Encounters:  10/10/22 116/68  08/01/22 (!) 178/106  06/11/22 130/76  Well-controlled hypertension Continue amlodipine 5 mg daily Cardiovascular risks associated with hypertension discussed Diet and nutrition discussed         Digestive   Gastroesophageal reflux disease    Asymptomatic and well-controlled. Not taking Protonix.        Other   Current smoker    Cardiovascular/cancer risks associated with smoking discussed Smoking cessation advice given      Dyslipidemia    Chronic stable condition Continue rosuvastatin 20 mg daily Diet and nutrition discussed       Arthralgia of multiple joints    Active and affecting quality of life Recommend meloxicam 15 mg daily as needed      Relevant Medications   meloxicam (MOBIC) 15 MG tablet   Other Visit Diagnoses     Need for vaccination       Relevant Orders   Flu Vaccine Trivalent High Dose (Fluad) (Completed)         Patient Instructions   Hypertension, Adult High blood pressure (hypertension) is when the force of blood pumping through the arteries is too strong. The arteries are the blood vessels that carry blood from the heart throughout the body. Hypertension forces the heart to work harder to pump blood and may cause arteries to become narrow or stiff. Untreated or uncontrolled hypertension can lead to a heart attack, heart failure, a stroke, kidney disease, and other problems. A blood pressure reading consists of a higher number over a lower number. Ideally, your blood pressure should be below 120/80. The first ("top") number is called the systolic pressure. It is a measure of the pressure in your arteries as your heart beats. The second ("bottom") number is called the diastolic pressure. It is a measure of the pressure in your arteries as the heart relaxes. What are the causes? The exact cause of this condition is not known. There are some conditions that result in high blood pressure. What increases the risk? Certain factors may make you more likely to develop high blood pressure. Some of these risk factors are under your control, including: Smoking. Not getting enough exercise or physical activity. Being overweight. Having too much fat, sugar, calories, or salt (sodium) in your diet. Drinking too much alcohol. Other risk factors include: Having a personal history of heart disease, diabetes, high cholesterol, or kidney disease. Stress. Having a family history of high blood pressure and high cholesterol. Having obstructive sleep apnea. Age. The risk increases with age. What are the signs or symptoms? High blood pressure may not cause symptoms. Very high blood pressure (hypertensive crisis) may cause: Headache. Fast or irregular heartbeats (palpitations). Shortness of breath. Nosebleed. Nausea and vomiting. Vision changes. Severe chest pain, dizziness, and seizures. How is this diagnosed? This condition is diagnosed  by measuring your blood pressure while you are seated, with your arm resting on a flat surface, your legs uncrossed,  and your feet flat on the floor. The cuff of the blood pressure monitor will be placed directly against the skin of your upper arm at the level of your heart. Blood pressure should be measured at least twice using the same arm. Certain conditions can cause a difference in blood pressure between your right and left arms. If you have a high blood pressure reading during one visit or you have normal blood pressure with other risk factors, you may be asked to: Return on a different day to have your blood pressure checked again. Monitor your blood pressure at home for 1 week or longer. If you are diagnosed with hypertension, you may have other blood or imaging tests to help your health care provider understand your overall risk for other conditions. How is this treated? This condition is treated by making healthy lifestyle changes, such as eating healthy foods, exercising more, and reducing your alcohol intake. You may be referred for counseling on a healthy diet and physical activity. Your health care provider may prescribe medicine if lifestyle changes are not enough to get your blood pressure under control and if: Your systolic blood pressure is above 130. Your diastolic blood pressure is above 80. Your personal target blood pressure may vary depending on your medical conditions, your age, and other factors. Follow these instructions at home: Eating and drinking  Eat a diet that is high in fiber and potassium, and low in sodium, added sugar, and fat. An example of this eating plan is called the DASH diet. DASH stands for Dietary Approaches to Stop Hypertension. To eat this way: Eat plenty of fresh fruits and vegetables. Try to fill one half of your plate at each meal with fruits and vegetables. Eat whole grains, such as whole-wheat pasta, brown rice, or whole-grain bread. Fill about one  fourth of your plate with whole grains. Eat or drink low-fat dairy products, such as skim milk or low-fat yogurt. Avoid fatty cuts of meat, processed or cured meats, and poultry with skin. Fill about one fourth of your plate with lean proteins, such as fish, chicken without skin, beans, eggs, or tofu. Avoid pre-made and processed foods. These tend to be higher in sodium, added sugar, and fat. Reduce your daily sodium intake. Many people with hypertension should eat less than 1,500 mg of sodium a day. Do not drink alcohol if: Your health care provider tells you not to drink. You are pregnant, may be pregnant, or are planning to become pregnant. If you drink alcohol: Limit how much you have to: 0-1 drink a day for women. 0-2 drinks a day for men. Know how much alcohol is in your drink. In the [place] one drink equals one 12 oz bottle of beer (355 mL), one 5 oz glass of wine (148 mL), or one 1 oz glass of hard liquor (44 mL). Lifestyle  Work with your health care provider to maintain a healthy body weight or to lose weight. Ask what an ideal weight is for you. Get at least 30 minutes of exercise that causes your heart to beat faster (aerobic exercise) most days of the week. Activities may include walking, swimming, or biking. Include exercise to strengthen your muscles (resistance exercise), such as Pilates or lifting weights, as part of your weekly exercise routine. Try to do these types of exercises for 30 minutes at least 3 days a week. Do not use any products that contain nicotine or tobacco. These products include cigarettes, chewing tobacco, and vaping devices, such  as e-cigarettes. If you need help quitting, ask your health care provider. Monitor your blood pressure at home as told by your health care provider. Keep all follow-up visits. This is important. Medicines Take over-the-counter and prescription medicines only as told by your health care provider. Follow directions carefully. Blood  pressure medicines must be taken as prescribed. Do not skip doses of blood pressure medicine. Doing this puts you at risk for problems and can make the medicine less effective. Ask your health care provider about side effects or reactions to medicines that you should watch for. Contact a health care provider if you: Think you are having a reaction to a medicine you are taking. Have headaches that keep coming back (recurring). Feel dizzy. Have swelling in your ankles. Have trouble with your vision. Get help right away if you: Develop a severe headache or confusion. Have unusual weakness or numbness. Feel faint. Have severe pain in your chest or abdomen. Vomit repeatedly. Have trouble breathing. These symptoms may be an emergency. Get help right away. Call 911. Do not wait to see if the symptoms will go away. Do not drive yourself to the hospital. Summary Hypertension is when the force of blood pumping through your arteries is too strong. If this condition is not controlled, it may put you at risk for serious complications. Your personal target blood pressure may vary depending on your medical conditions, your age, and other factors. For most people, a normal blood pressure is less than 120/80. Hypertension is treated with lifestyle changes, medicines, or a combination of both. Lifestyle changes include losing weight, eating a healthy, low-sodium diet, exercising more, and limiting alcohol. This information is not intended to replace advice given to you by your health care provider. Make sure you discuss any questions you have with your health care provider. Document Revised: 12/06/2020 Document Reviewed: 12/06/2020 Elsevier Patient Education  2024 Elsevier Inc.     Edwina Barth, MD [facility]

## 2022-10-10 NOTE — Patient Instructions (Signed)
Hypertension, Adult High blood pressure (hypertension) is when the force of blood pumping through the arteries is too strong. The arteries are the blood vessels that carry blood from the heart throughout the body. Hypertension forces the heart to work harder to pump blood and may cause arteries to become narrow or stiff. Untreated or uncontrolled hypertension can lead to a heart attack, heart failure, a stroke, kidney disease, and other problems. A blood pressure reading consists of a higher number over a lower number. Ideally, your blood pressure should be below 120/80. The first ("top") number is called the systolic pressure. It is a measure of the pressure in your arteries as your heart beats. The second ("bottom") number is called the diastolic pressure. It is a measure of the pressure in your arteries as the heart relaxes. What are the causes? The exact cause of this condition is not known. There are some conditions that result in high blood pressure. What increases the risk? Certain factors may make you more likely to develop high blood pressure. Some of these risk factors are under your control, including: Smoking. Not getting enough exercise or physical activity. Being overweight. Having too much fat, sugar, calories, or salt (sodium) in your diet. Drinking too much alcohol. Other risk factors include: Having a personal history of heart disease, diabetes, high cholesterol, or kidney disease. Stress. Having a family history of high blood pressure and high cholesterol. Having obstructive sleep apnea. Age. The risk increases with age. What are the signs or symptoms? High blood pressure may not cause symptoms. Very high blood pressure (hypertensive crisis) may cause: Headache. Fast or irregular heartbeats (palpitations). Shortness of breath. Nosebleed. Nausea and vomiting. Vision changes. Severe chest pain, dizziness, and seizures. How is this diagnosed? This condition is diagnosed by  measuring your blood pressure while you are seated, with your arm resting on a flat surface, your legs uncrossed, and your feet flat on the floor. The cuff of the blood pressure monitor will be placed directly against the skin of your upper arm at the level of your heart. Blood pressure should be measured at least twice using the same arm. Certain conditions can cause a difference in blood pressure between your right and left arms. If you have a high blood pressure reading during one visit or you have normal blood pressure with other risk factors, you may be asked to: Return on a different day to have your blood pressure checked again. Monitor your blood pressure at home for 1 week or longer. If you are diagnosed with hypertension, you may have other blood or imaging tests to help your health care provider understand your overall risk for other conditions. How is this treated? This condition is treated by making healthy lifestyle changes, such as eating healthy foods, exercising more, and reducing your alcohol intake. You may be referred for counseling on a healthy diet and physical activity. Your health care provider may prescribe medicine if lifestyle changes are not enough to get your blood pressure under control and if: Your systolic blood pressure is above 130. Your diastolic blood pressure is above 80. Your personal target blood pressure may vary depending on your medical conditions, your age, and other factors. Follow these instructions at home: Eating and drinking  Eat a diet that is high in fiber and potassium, and low in sodium, added sugar, and fat. An example of this eating plan is called the DASH diet. DASH stands for Dietary Approaches to Stop Hypertension. To eat this way: Eat   plenty of fresh fruits and vegetables. Try to fill one half of your plate at each meal with fruits and vegetables. Eat whole grains, such as whole-wheat pasta, brown rice, or whole-grain bread. Fill about one  fourth of your plate with whole grains. Eat or drink low-fat dairy products, such as skim milk or low-fat yogurt. Avoid fatty cuts of meat, processed or cured meats, and poultry with skin. Fill about one fourth of your plate with lean proteins, such as fish, chicken without skin, beans, eggs, or tofu. Avoid pre-made and processed foods. These tend to be higher in sodium, added sugar, and fat. Reduce your daily sodium intake. Many people with hypertension should eat less than 1,500 mg of sodium a day. Do not drink alcohol if: Your health care provider tells you not to drink. You are pregnant, may be pregnant, or are planning to become pregnant. If you drink alcohol: Limit how much you have to: 0-1 drink a day for women. 0-2 drinks a day for men. Know how much alcohol is in your drink. In the [place] one drink equals one 12 oz bottle of beer (355 mL), one 5 oz glass of wine (148 mL), or one 1 oz glass of hard liquor (44 mL). Lifestyle  Work with your health care provider to maintain a healthy body weight or to lose weight. Ask what an ideal weight is for you. Get at least 30 minutes of exercise that causes your heart to beat faster (aerobic exercise) most days of the week. Activities may include walking, swimming, or biking. Include exercise to strengthen your muscles (resistance exercise), such as Pilates or lifting weights, as part of your weekly exercise routine. Try to do these types of exercises for 30 minutes at least 3 days a week. Do not use any products that contain nicotine or tobacco. These products include cigarettes, chewing tobacco, and vaping devices, such as e-cigarettes. If you need help quitting, ask your health care provider. Monitor your blood pressure at home as told by your health care provider. Keep all follow-up visits. This is important. Medicines Take over-the-counter and prescription medicines only as told by your health care provider. Follow directions carefully. Blood  pressure medicines must be taken as prescribed. Do not skip doses of blood pressure medicine. Doing this puts you at risk for problems and can make the medicine less effective. Ask your health care provider about side effects or reactions to medicines that you should watch for. Contact a health care provider if you: Think you are having a reaction to a medicine you are taking. Have headaches that keep coming back (recurring). Feel dizzy. Have swelling in your ankles. Have trouble with your vision. Get help right away if you: Develop a severe headache or confusion. Have unusual weakness or numbness. Feel faint. Have severe pain in your chest or abdomen. Vomit repeatedly. Have trouble breathing. These symptoms may be an emergency. Get help right away. Call 911. Do not wait to see if the symptoms will go away. Do not drive yourself to the hospital. Summary Hypertension is when the force of blood pumping through your arteries is too strong. If this condition is not controlled, it may put you at risk for serious complications. Your personal target blood pressure may vary depending on your medical conditions, your age, and other factors. For most people, a normal blood pressure is less than 120/80. Hypertension is treated with lifestyle changes, medicines, or a combination of both. Lifestyle changes include losing weight, eating a healthy,   low-sodium diet, exercising more, and limiting alcohol. This information is not intended to replace advice given to you by your health care provider. Make sure you discuss any questions you have with your health care provider. Document Revised: 12/06/2020 Document Reviewed: 12/06/2020 Elsevier Patient Education  2024 [employer].  

## 2022-10-10 NOTE — Assessment & Plan Note (Signed)
Asymptomatic and well-controlled. Not taking Protonix.

## 2022-10-10 NOTE — Assessment & Plan Note (Signed)
Chronic stable condition Continue rosuvastatin 20 mg daily Diet and nutrition discussed

## 2022-11-07 ENCOUNTER — Other Ambulatory Visit: Payer: Self-pay | Admitting: Emergency Medicine

## 2022-11-07 DIAGNOSIS — M2550 Pain in unspecified joint: Secondary | ICD-10-CM

## 2023-01-16 ENCOUNTER — Telehealth: Payer: Self-pay | Admitting: Emergency Medicine

## 2023-01-16 NOTE — Telephone Encounter (Signed)
Prescription Request  01/16/2023  LOV: 10/10/2022  What is the name of the medication or equipment?  amLODipine (NORVASC) 5 MG tablet   Have you contacted your pharmacy to request a refill? No   Which pharmacy would you like this sent to?   [facility]  [address] [place], [place] [address] Phone: [phone]  Patient notified that their request is being sent to the clinical staff for review and that they should receive a response within 2 business days.   Please advise at Mobile [phone] (mobile)

## 2023-01-18 ENCOUNTER — Other Ambulatory Visit: Payer: Self-pay | Admitting: Radiology

## 2023-01-18 DIAGNOSIS — I10 Essential (primary) hypertension: Secondary | ICD-10-CM

## 2023-01-18 MED ORDER — AMLODIPINE BESYLATE 5 MG PO TABS
5.0000 mg | ORAL_TABLET | Freq: Every day | ORAL | 3 refills | Status: DC
Start: 2023-01-18 — End: 2023-02-25

## 2023-01-24 ENCOUNTER — Other Ambulatory Visit: Payer: Self-pay

## 2023-01-24 ENCOUNTER — Ambulatory Visit (INDEPENDENT_AMBULATORY_CARE_PROVIDER_SITE_OTHER): Payer: Medicare Other

## 2023-01-24 ENCOUNTER — Ambulatory Visit: Payer: Medicare Other | Admitting: Family Medicine

## 2023-01-24 VITALS — BP 142/94 | Ht 71.0 in | Wt 153.0 lb

## 2023-01-24 DIAGNOSIS — M79605 Pain in left leg: Secondary | ICD-10-CM

## 2023-01-24 DIAGNOSIS — M25512 Pain in left shoulder: Secondary | ICD-10-CM

## 2023-01-24 DIAGNOSIS — M5416 Radiculopathy, lumbar region: Secondary | ICD-10-CM | POA: Diagnosis not present

## 2023-01-24 DIAGNOSIS — G8929 Other chronic pain: Secondary | ICD-10-CM | POA: Diagnosis not present

## 2023-01-24 DIAGNOSIS — M79604 Pain in right leg: Secondary | ICD-10-CM | POA: Diagnosis not present

## 2023-01-24 DIAGNOSIS — I739 Peripheral vascular disease, unspecified: Secondary | ICD-10-CM

## 2023-01-24 DIAGNOSIS — M542 Cervicalgia: Secondary | ICD-10-CM | POA: Diagnosis not present

## 2023-01-24 NOTE — Progress Notes (Signed)
Rubin Payor, PhD, LAT, ATC acting as a scribe for [name], MD.  Randall Velasquez is a 67 y.o. male who presents to [facility] at [facility] today for exacerbation of his L shoulder pain. Pt was last seen by Dr. Denyse Amass on 06/11/22 and a MRI was ordered, but never scheduled/done.  Today, pt reports L shoulder pain is terrible. He also note pain along the R-side of his neck that "pulls" into his chest. He also c/o bilat knee pain, low back pain R-side.    He notes considerable bilateral lower extremity pain at rest and with activity.  He does have a history of lumbar radiculopathy and has had epidural steroid injections in the past.  Dx imaging: L shoulder MRI--- ordered in April not completed 04/23/22 L shoulder XR   Pertinent review of systems: No fevers or chills  Relevant historical information: Known peripheral arterial disease   Exam:  BP (!) 142/94 (Cuff Size: Normal)   Ht 5\' 11"  (1.803 m)   Wt 153 lb (69.4 kg)   BMI 21.34 kg/m  General: Well Developed, well nourished, and in no acute distress.   MSK: C-spine: Normal appearing Tender palpation cervical paraspinal musculature and decree cervical motion.  Left shoulder abduction range of motion 130 degrees.  Functional internal rotation lumbar spine external rotation full.  L-spine nontender to palpation midline lower extremity strength is intact. Pulses absent bilateral lower extremities dorsal pedis and posterior tibialis. Capillary fill and sensation is intact distal bilateral lower extremities.    Lab and Radiology Results  X-ray images cervical spine obtained today personally and independently interpreted Multilevel degenerative changes without acute fractures. Await formal radiology review     Assessment and Plan: 67 y.o. male with multiple pain generators and locations.  The most pressing issue today is the bilateral lower extremity pain at rest and with activity.  He has known  peripheral arterial disease and his symptoms are concerning for claudication.  Plan for ABI to be done in the near future.  Additionally he does have lumbar radiculopathy based on an MRI from October 2022.  Plan for trial of an epidural steroid injection in the meantime.  This may be helpful.  Additionally he notes neck pain due to arthritis and muscle spasm and dysfunction.  Physical therapy should be helpful for this but again we need to focus on his legs for now.  Recheck back in clinic once we get the results of the ABI.  He asked for refill of meloxicam which I did not refill.  His blood pressure is elevated and he has ABI with potential worsening claudication.  NSAIDs could make that worse.  Recommend Tylenol arthritis.   PDMP not reviewed this encounter. Orders Placed This Encounter  Procedures   DG Cervical Spine 2 or 3 views    Standing Status:   Future    Number of Occurrences:   1    Expiration Date:   01/24/2024    Reason for Exam (SYMPTOM  OR DIAGNOSIS REQUIRED):   neck pain    Preferred imaging location?:   [facility]   DG INJECT DIAG/THERA/INC NEEDLE/CATH/PLC EPI/LUMB/SAC W/IMG    Level and technique per radiology    Standing Status:   Future    Expiration Date:   02/24/2023    Reason for Exam (SYMPTOM  OR DIAGNOSIS REQUIRED):   Low back pain    Preferred Imaging Location?:   [facility]   No orders of the  defined types were placed in this encounter.    Discussed warning signs or symptoms. Please see discharge instructions. Patient expresses understanding.   The above documentation has been reviewed and is accurate and complete [name], M.D.

## 2023-01-24 NOTE — Patient Instructions (Addendum)
Thank you for coming in today.  Please call [facility] ([facility]) at [phone] to schedule your spine injection.    Please get an Xray today before you leave   Check back once we get the test results back

## 2023-01-28 NOTE — Discharge Instructions (Signed)

## 2023-01-29 ENCOUNTER — Ambulatory Visit
Admission: RE | Admit: 2023-01-29 | Discharge: 2023-01-29 | Disposition: A | Discharge status: Home / Self Care | Payer: Medicare Other | Source: Ambulatory Visit | Attending: Family Medicine | Admitting: Family Medicine

## 2023-01-29 DIAGNOSIS — M5416 Radiculopathy, lumbar region: Secondary | ICD-10-CM

## 2023-01-29 MED ORDER — METHYLPREDNISOLONE ACETATE 40 MG/ML INJ SUSP (RADIOLOG
80.0000 mg | Freq: Once | INTRAMUSCULAR | Status: AC
  Administered 2023-01-29: 80 mg via EPIDURAL

## 2023-01-29 MED ORDER — IOPAMIDOL (ISOVUE-M 200) INJECTION 41%
1.0000 mL | Freq: Once | INTRAMUSCULAR | Status: AC
Start: 2023-01-29 — End: 2023-01-29
  Administered 2023-01-29: 1 mL via EPIDURAL

## 2023-02-11 ENCOUNTER — Other Ambulatory Visit: Payer: Medicare Other

## 2023-02-11 NOTE — Progress Notes (Signed)
Cervical spine x-ray shows multilevel arthritis.

## 2023-02-20 ENCOUNTER — Ambulatory Visit: Payer: Medicare Other | Admitting: Family Medicine

## 2023-02-20 ENCOUNTER — Encounter: Payer: Self-pay | Admitting: Family Medicine

## 2023-02-20 VITALS — BP 166/88 | HR 66 | Ht 71.0 in | Wt 152.0 lb

## 2023-02-20 DIAGNOSIS — I739 Peripheral vascular disease, unspecified: Secondary | ICD-10-CM

## 2023-02-20 DIAGNOSIS — M542 Cervicalgia: Secondary | ICD-10-CM | POA: Diagnosis not present

## 2023-02-20 DIAGNOSIS — M5416 Radiculopathy, lumbar region: Secondary | ICD-10-CM | POA: Diagnosis not present

## 2023-02-20 NOTE — Progress Notes (Signed)
 I, Randall Velasquez, CMA acting as a scribe for Randall Lloyd, MD.  Randall Velasquez is a 68 y.o. male who presents to [facility] at [facility] today for f/u neck pain and to discuss XR results. Pt was last seen by Dr. Lloyd on 01/24/23 and was advised to use Tylenol and ABI was ordered.  Today, pt reports neck pain continues and into the anterior R chest. Pt recalls an episode of amnesia yesterday after painting an upstairs bathroom. He recalls just waking up in the shop but does not recall how he got there. He notes some dizziness when looking up to paint ceilings.  He has numbness in both hands/fingers. He also mentioned L shoulder pain  Additionally he notes pain radiating down both legs.  Dx testing: Vasc US  ABI-- scheduled 02/22/23 01/24/23 C-spine XR  Pertinent review of systems: No fevers or chills  Relevant historical information: Peripheral arterial disease   Exam:  BP (!) 166/88   Pulse 66   Ht 5' 11 (1.803 m)   Wt 152 lb (68.9 kg)   SpO2 99%   BMI 21.20 kg/m  General: Well Developed, well nourished, and in no acute distress.   MSK: L-spine nontender palpation normal lumbar motion.    Lab and Radiology Results   EXAM: CERVICAL SPINE - 2-3 VIEW   COMPARISON:  None Available.   FINDINGS: The prevertebral soft tissues are normal. The alignment is anatomic through T1. There is no evidence of acute fracture or traumatic subluxation. The C1-2 articulation appears normal in the AP projection. The cervicothoracic junction is not well visualized in the lateral projection. There is multilevel spondylosis with relatively mild disc space narrowing and uncinate spurring. Prominent multilevel facet hypertrophy, worse on the right.   IMPRESSION: Multilevel cervical spondylosis with prominent facet hypertrophy, worse on the right. No acute osseous findings or malalignment.     Electronically Signed   By: Randall Velasquez M.D.   On: 02/07/2023  11:13   EXAM: DG INJECT/THERA INC NEEDLE/CATH/PLC EPI/LUMB/SAC W/IMG   FLUOROSCOPY: Radiation Exposure Index (as provided by the fluoroscopic device): 3.30 mGy Kerma   PROCEDURE: The procedure, risks, benefits, and alternatives were explained to the patient. Questions regarding the procedure were encouraged and answered. The patient understands and consents to the procedure.   LEFT L5 NERVE ROOT BLOCK AND TRANSFORAMINAL EPIDURAL: A posterior oblique approach was taken to the intervertebral foramen on the left at L5-S1 using a curved 3.5 inch 22 gauge spinal needle. Injection of Isovue -M 200 outlined the left L5 nerve root and showed good epidural spread. No vascular opacification is seen. 80 mg of Depo-Medrol  mixed with 2 mL of 1% lidocaine  were instilled. The procedure was well-tolerated, and the patient was discharged thirty minutes following the injection in good condition.   COMPLICATIONS: None   IMPRESSION: Technically successful injection consisting of a left L5 nerve root block and transforaminal epidural.     Electronically Signed   By: [name] M.D.   On: 01/29/2023 15:44   I, Randall Velasquez, personally (independently) visualized and performed the interpretation of the images attached in this note.    Assessment and Plan: 68 y.o. male with upper and lower extremity pain.  Lower extremity pain due to lumbar radiculopathy and also probably due to claudication.  He had an epidural steroid injection on December 17 for lumbar radiculopathy targeting the left L5 nerve.  This helped but did not last.  If his symptoms persist neck step for lumbar radiculopathy  would be repeat MRI lumbar spine.  His most recent MRI is from October 2022.  He has an ABI scheduled in the near future which should help evaluate lower extremity claudication.  That may be the dominant issue going forward.  Neck pain due to degenerative changes.  Consider MRI in the future for injection  planning.   PDMP not reviewed this encounter. No orders of the defined types were placed in this encounter.  No orders of the defined types were placed in this encounter.    Discussed warning signs or symptoms. Please see discharge instructions. Patient expresses understanding.   The above documentation has been reviewed and is accurate and complete Randall Velasquez, M.D. Total encounter time 30 minutes including face-to-face time with the patient and, reviewing past medical record, and charting on the date of service.

## 2023-02-20 NOTE — Patient Instructions (Signed)
 Thank you for coming in today.   Schedule an appointment with Randall Velasquez, Randall Jose, MD for your blood pressure.   You could go upstairs after this visit and schedule it.   Keep your appointment with the blood flow test this week.   I may be getting a MRI.

## 2023-02-22 ENCOUNTER — Ambulatory Visit (HOSPITAL_COMMUNITY): Admission: RE | Admit: 2023-02-22 | Payer: Medicare Other | Source: Ambulatory Visit

## 2023-02-25 ENCOUNTER — Ambulatory Visit (INDEPENDENT_AMBULATORY_CARE_PROVIDER_SITE_OTHER): Payer: Medicare Other | Admitting: Emergency Medicine

## 2023-02-25 ENCOUNTER — Encounter: Payer: Self-pay | Admitting: Emergency Medicine

## 2023-02-25 VITALS — BP 132/82 | HR 72 | Temp 98.2°F | Ht 71.0 in | Wt 156.0 lb

## 2023-02-25 DIAGNOSIS — I739 Peripheral vascular disease, unspecified: Secondary | ICD-10-CM

## 2023-02-25 DIAGNOSIS — E785 Hyperlipidemia, unspecified: Secondary | ICD-10-CM

## 2023-02-25 DIAGNOSIS — K219 Gastro-esophageal reflux disease without esophagitis: Secondary | ICD-10-CM

## 2023-02-25 DIAGNOSIS — I10 Essential (primary) hypertension: Secondary | ICD-10-CM | POA: Diagnosis not present

## 2023-02-25 DIAGNOSIS — M2550 Pain in unspecified joint: Secondary | ICD-10-CM | POA: Diagnosis not present

## 2023-02-25 DIAGNOSIS — F17200 Nicotine dependence, unspecified, uncomplicated: Secondary | ICD-10-CM | POA: Diagnosis not present

## 2023-02-25 MED ORDER — AMLODIPINE BESYLATE 10 MG PO TABS: 10.0000 mg | ORAL_TABLET | Freq: Every day | ORAL | 3 refills | Status: AC

## 2023-02-25 NOTE — Assessment & Plan Note (Signed)
Cardiovascular/cancer risks associated with smoking discussed. Smoking cessation advice given.

## 2023-02-25 NOTE — Assessment & Plan Note (Signed)
 Was evaluated by vascular surgeon but did not follow-up Asymptomatic at present time

## 2023-02-25 NOTE — Assessment & Plan Note (Signed)
 Asymptomatic at present time Not on medication

## 2023-02-25 NOTE — Progress Notes (Signed)
 Randall Velasquez 68 y.o.   Chief Complaint  Patient presents with   Hypertension    Patient states his blood pressure was high at the dentist office. Patient states before he started taking 2 of the 5mg  of amlodipine  he was getting dizzy often and had one occasion where his vision was blurred. He states he has less dizziness    HISTORY OF PRESENT ILLNESS: This is a 68 y.o. male complaining of elevated blood pressure readings at home and at the dentist office Started taking amlodipine  10 mg with good results.  Blood pressure back to normal.  Prior to dose change patient was having episodes of dizziness and blurred vision which are now gone.  Asymptomatic at present time.  No other complaints or medical concerns today. BP Readings from Last 3 Encounters:  02/25/23 132/82  02/20/23 (!) 166/88  01/29/23 (!) 165/97     Hypertension Pertinent negatives include no chest pain, headaches, palpitations or shortness of breath.     Prior to Admission medications   Medication Sig Start Date End Date Taking? Authorizing Provider  amLODipine  (NORVASC ) 5 MG tablet Take 1 tablet (5 mg total) by mouth daily. 01/18/23  Yes Aran Menning, Emil Schanz, MD  lidocaine  (HM LIDOCAINE  PATCH) 4 % Place 1 patch onto the skin daily. Patient not taking: Reported on 02/25/2023 04/23/22   Corey, Evan S, MD  mometasone  (ELOCON ) 0.1 % cream APPLY TO AFFECTED AREA TOPICALLY EVERY DAY Patient not taking: Reported on 02/25/2023 07/31/21   [place], Quintin PARAS, MD  pantoprazole  (PROTONIX ) 20 MG tablet Take 1 tablet (20 mg total) by mouth daily. Patient not taking: Reported on 02/25/2023 04/18/21   [place], Quintin PARAS, MD  rosuvastatin  (CRESTOR ) 20 MG tablet Take 1 tablet (20 mg total) by mouth daily. Follow-up appt due in Sept must see provider for future refills Patient not taking: Reported on 02/25/2023 09/06/22   Purcell Emil Schanz, MD    No Known Allergies  Patient Active Problem List   Diagnosis Date Noted   Arthralgia  of multiple joints 10/10/2022   Statin intolerance 02/08/2022   History of gastroesophageal reflux (GERD) 06/29/2021   Dyslipidemia 06/29/2021   Current smoker 05/26/2020   Chronic left-sided thoracic back pain 05/26/2020   PAD (peripheral artery disease) (HCC) 05/19/2020   Essential hypertension 09/30/2019   Gastroesophageal reflux disease 09/30/2019    Past Medical History:  Diagnosis Date   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   GERD (gastroesophageal reflux disease)    Hyperlipidemia    Hypertension     Past Surgical History:  Procedure Laterality Date   HERNIA REPAIR     as a child    Social History   Socioeconomic History   Marital status: Single    Spouse name: Not on file   Number of children: Not on file   Years of education: Not on file   Highest education level: Not on file  Occupational History   Not on file  Tobacco Use   Smoking status: Every Day    Types: Cigars   Smokeless tobacco: Never  Vaping Use   Vaping status: Never Used  Substance and Sexual Activity   Alcohol use: Yes    Alcohol/week: 2.0 standard drinks of alcohol    Types: 2 Shots of liquor per week   Drug use: Not Currently    Types: Marijuana    Comment: stopped in 20s   Sexual activity: Yes  Other Topics Concern   Not on file  Social History Narrative   Not on file   Social Drivers of Health   Financial Resource Strain: Low Risk  (08/08/2022)   Overall Financial Resource Strain (CARDIA)    Difficulty of Paying Living Expenses: Not very hard  Food Insecurity: No Food Insecurity (08/08/2022)   Hunger Vital Sign    Worried About Running Out of Food in the Last Year: Never true    Ran Out of Food in the Last Year: Never true  Transportation Needs: No Transportation Needs (08/08/2022)   PRAPARE - Administrator, Civil Service (Medical): No    Lack of Transportation (Non-Medical): No  Physical Activity: Sufficiently Active  (08/08/2022)   Exercise Vital Sign    Days of Exercise per Week: 5 days    Minutes of Exercise per Session: 60 min  Stress: No Stress Concern Present (08/08/2022)   [employer] of Occupational Health - Occupational Stress Questionnaire    Feeling of Stress : Not at all  Social Connections: Moderately Isolated (08/08/2022)   Social Connection and Isolation Panel [NHANES]    Frequency of Communication with Friends and Family: More than three times a week    Frequency of Social Gatherings with Friends and Family: More than three times a week    Attends Religious Services: Never    Database Administrator or Organizations: No    Attends Banker Meetings: Never    Marital Status: Living with partner  Intimate Partner Violence: Not At Risk (08/08/2022)   Humiliation, Afraid, Rape, and Kick questionnaire    Fear of Current or Ex-Partner: No    Emotionally Abused: No    Physically Abused: No    Sexually Abused: No    Family History  Problem Relation Age of Onset   Colon cancer Neg Hx    Colon polyps Neg Hx    Esophageal cancer Neg Hx    Stomach cancer Neg Hx    Rectal cancer Neg Hx      Review of Systems  Constitutional: Negative.  Negative for chills and fever.  HENT: Negative.  Negative for congestion and sore throat.   Respiratory: Negative.  Negative for cough and shortness of breath.   Cardiovascular: Negative.  Negative for chest pain and palpitations.  Gastrointestinal:  Negative for abdominal pain, diarrhea, nausea and vomiting.  Genitourinary: Negative.  Negative for dysuria and hematuria.  Skin: Negative.  Negative for rash.  Neurological: Negative.  Negative for dizziness and headaches.  All other systems reviewed and are negative.   Vitals:   02/25/23 1559  BP: 132/82  Pulse: 72  Temp: 98.2 F (36.8 C)  SpO2: 97%    Physical Exam Vitals reviewed.  Constitutional:      Appearance: Normal appearance.  HENT:     Head: Normocephalic.      Mouth/Throat:     Mouth: Mucous membranes are moist.     Pharynx: Oropharynx is clear.  Eyes:     Extraocular Movements: Extraocular movements intact.     Pupils: Pupils are equal, round, and reactive to light.  Cardiovascular:     Rate and Rhythm: Normal rate and regular rhythm.     Pulses: Normal pulses.     Heart sounds: Normal heart sounds.  Pulmonary:     Effort: Pulmonary effort is normal.     Breath sounds: Normal breath sounds.  Abdominal:     Palpations: Abdomen is soft.     Tenderness:  There is no abdominal tenderness.  Musculoskeletal:     Cervical back: No tenderness.  Lymphadenopathy:     Cervical: No cervical adenopathy.  Skin:    General: Skin is warm and dry.     Capillary Refill: Capillary refill takes less than 2 seconds.  Neurological:     General: No focal deficit present.     Mental Status: He is alert and oriented to person, place, and time.  Psychiatric:        Mood and Affect: Mood normal.        Behavior: Behavior normal.      ASSESSMENT & PLAN: A total of 45 minutes was spent with the patient and counseling/coordination of care regarding preparing for this visit, review of most recent office visit notes, review of multiple chronic medical conditions and their management, cardiovascular risks associated with hypertension, review of all medications, review of most recent bloodwork results, review of health maintenance items, education on nutrition, prognosis, documentation, and need for follow up.   Problem List Items Addressed This Visit       Cardiovascular and Mediastinum   Essential hypertension - Primary   BP Readings from Last 3 Encounters:  02/25/23 132/82  02/20/23 (!) 166/88  01/29/23 (!) 165/97  Blood pressure was elevated on 5 mg of amlodipine  Since increasing dose to 10 mg daily blood pressure has normalized Continue amlodipine  10 mg daily.  New prescription sent today to pharmacy of record. Cardiovascular risks associated with  hypertension discussed Diet and nutrition discussed Smoking cessation discussed       Relevant Medications   amLODipine  (NORVASC ) 10 MG tablet   PAD (peripheral artery disease) (HCC)   Was evaluated by vascular surgeon but did not follow-up Asymptomatic at present time      Relevant Medications   amLODipine  (NORVASC ) 10 MG tablet     Digestive   Gastroesophageal reflux disease   Asymptomatic at present time Not on medication        Other   Current smoker   Cardiovascular/cancer risks associated with smoking discussed Smoking cessation advice given      Dyslipidemia   Diet and nutrition discussed Not taking rosuvastatin  at present time      Arthralgia of multiple joints   Stable.  Pain management discussed.      Patient Instructions  Hypertension, Adult High blood pressure (hypertension) is when the force of blood pumping through the arteries is too strong. The arteries are the blood vessels that carry blood from the heart throughout the body. Hypertension forces the heart to work harder to pump blood and may cause arteries to become narrow or stiff. Untreated or uncontrolled hypertension can lead to a heart attack, heart failure, a stroke, kidney disease, and other problems. A blood pressure reading consists of a higher number over a lower number. Ideally, your blood pressure should be below 120/80. The first (top) number is called the systolic pressure. It is a measure of the pressure in your arteries as your heart beats. The second (bottom) number is called the diastolic pressure. It is a measure of the pressure in your arteries as the heart relaxes. What are the causes? The exact cause of this condition is not known. There are some conditions that result in high blood pressure. What increases the risk? Certain factors may make you more likely to develop high blood pressure. Some of these risk factors are under your control, including: Smoking. Not getting enough  exercise or physical activity. Being overweight. Having  too much fat, sugar, calories, or salt (sodium) in your diet. Drinking too much alcohol. Other risk factors include: Having a personal history of heart disease, diabetes, high cholesterol, or kidney disease. Stress. Having a family history of high blood pressure and high cholesterol. Having obstructive sleep apnea. Age. The risk increases with age. What are the signs or symptoms? High blood pressure may not cause symptoms. Very high blood pressure (hypertensive crisis) may cause: Headache. Fast or irregular heartbeats (palpitations). Shortness of breath. Nosebleed. Nausea and vomiting. Vision changes. Severe chest pain, dizziness, and seizures. How is this diagnosed? This condition is diagnosed by measuring your blood pressure while you are seated, with your arm resting on a flat surface, your legs uncrossed, and your feet flat on the floor. The cuff of the blood pressure monitor will be placed directly against the skin of your upper arm at the level of your heart. Blood pressure should be measured at least twice using the same arm. Certain conditions can cause a difference in blood pressure between your right and left arms. If you have a high blood pressure reading during one visit or you have normal blood pressure with other risk factors, you may be asked to: Return on a different day to have your blood pressure checked again. Monitor your blood pressure at home for 1 week or longer. If you are diagnosed with hypertension, you may have other blood or imaging tests to help your health care provider understand your overall risk for other conditions. How is this treated? This condition is treated by making healthy lifestyle changes, such as eating healthy foods, exercising more, and reducing your alcohol intake. You may be referred for counseling on a healthy diet and physical activity. Your health care provider may prescribe medicine  if lifestyle changes are not enough to get your blood pressure under control and if: Your systolic blood pressure is above 130. Your diastolic blood pressure is above 80. Your personal target blood pressure may vary depending on your medical conditions, your age, and other factors. Follow these instructions at home: Eating and drinking  Eat a diet that is high in fiber and potassium, and low in sodium, added sugar, and fat. An example of this eating plan is called the DASH diet. DASH stands for Dietary Approaches to Stop Hypertension. To eat this way: Eat plenty of fresh fruits and vegetables. Try to fill one half of your plate at each meal with fruits and vegetables. Eat whole grains, such as whole-wheat pasta, brown rice, or whole-grain bread. Fill about one fourth of your plate with whole grains. Eat or drink low-fat dairy products, such as skim milk or low-fat yogurt. Avoid fatty cuts of meat, processed or cured meats, and poultry with skin. Fill about one fourth of your plate with lean proteins, such as fish, chicken without skin, beans, eggs, or tofu. Avoid pre-made and processed foods. These tend to be higher in sodium, added sugar, and fat. Reduce your daily sodium intake. Many people with hypertension should eat less than 1,500 mg of sodium a day. Do not drink alcohol if: Your health care provider tells you not to drink. You are pregnant, may be pregnant, or are planning to become pregnant. If you drink alcohol: Limit how much you have to: 0-1 drink a day for women. 0-2 drinks a day for men. Know how much alcohol is in your drink. In the [place] one drink equals one 12 oz bottle of beer (355 mL), one 5 oz glass of  wine (148 mL), or one 1 oz glass of hard liquor (44 mL). Lifestyle  Work with your health care provider to maintain a healthy body weight or to lose weight. Ask what an ideal weight is for you. Get at least 30 minutes of exercise that causes your heart to beat faster  (aerobic exercise) most days of the week. Activities may include walking, swimming, or biking. Include exercise to strengthen your muscles (resistance exercise), such as Pilates or lifting weights, as part of your weekly exercise routine. Try to do these types of exercises for 30 minutes at least 3 days a week. Do not use any products that contain nicotine or tobacco. These products include cigarettes, chewing tobacco, and vaping devices, such as e-cigarettes. If you need help quitting, ask your health care provider. Monitor your blood pressure at home as told by your health care provider. Keep all follow-up visits. This is important. Medicines Take over-the-counter and prescription medicines only as told by your health care provider. Follow directions carefully. Blood pressure medicines must be taken as prescribed. Do not skip doses of blood pressure medicine. Doing this puts you at risk for problems and can make the medicine less effective. Ask your health care provider about side effects or reactions to medicines that you should watch for. Contact a health care provider if you: Think you are having a reaction to a medicine you are taking. Have headaches that keep coming back (recurring). Feel dizzy. Have swelling in your ankles. Have trouble with your vision. Get help right away if you: Develop a severe headache or confusion. Have unusual weakness or numbness. Feel faint. Have severe pain in your chest or abdomen. Vomit repeatedly. Have trouble breathing. These symptoms may be an emergency. Get help right away. Call 911. Do not wait to see if the symptoms will go away. Do not drive yourself to the hospital. Summary Hypertension is when the force of blood pumping through your arteries is too strong. If this condition is not controlled, it may put you at risk for serious complications. Your personal target blood pressure may vary depending on your medical conditions, your age, and other  factors. For most people, a normal blood pressure is less than 120/80. Hypertension is treated with lifestyle changes, medicines, or a combination of both. Lifestyle changes include losing weight, eating a healthy, low-sodium diet, exercising more, and limiting alcohol. This information is not intended to replace advice given to you by your health care provider. Make sure you discuss any questions you have with your health care provider. Document Revised: 12/06/2020 Document Reviewed: 12/06/2020 Elsevier Patient Education  2024 [employer] Inc[employer]     Emil Schaumann, MD [facility]

## 2023-02-25 NOTE — Patient Instructions (Signed)
 Hypertension, Adult High blood pressure (hypertension) is when the force of blood pumping through the arteries is too strong. The arteries are the blood vessels that carry blood from the heart throughout the body. Hypertension forces the heart to work harder to pump blood and may cause arteries to become narrow or stiff. Untreated or uncontrolled hypertension can lead to a heart attack, heart failure, a stroke, kidney disease, and other problems. A blood pressure reading consists of a higher number over a lower number. Ideally, your blood pressure should be below 120/80. The first ("top") number is called the systolic pressure. It is a measure of the pressure in your arteries as your heart beats. The second ("bottom") number is called the diastolic pressure. It is a measure of the pressure in your arteries as the heart relaxes. What are the causes? The exact cause of this condition is not known. There are some conditions that result in high blood pressure. What increases the risk? Certain factors may make you more likely to develop high blood pressure. Some of these risk factors are under your control, including: Smoking. Not getting enough exercise or physical activity. Being overweight. Having too much fat, sugar, calories, or salt (sodium) in your diet. Drinking too much alcohol. Other risk factors include: Having a personal history of heart disease, diabetes, high cholesterol, or kidney disease. Stress. Having a family history of high blood pressure and high cholesterol. Having obstructive sleep apnea. Age. The risk increases with age. What are the signs or symptoms? High blood pressure may not cause symptoms. Very high blood pressure (hypertensive crisis) may cause: Headache. Fast or irregular heartbeats (palpitations). Shortness of breath. Nosebleed. Nausea and vomiting. Vision changes. Severe chest pain, dizziness, and seizures. How is this diagnosed? This condition is diagnosed by  measuring your blood pressure while you are seated, with your arm resting on a flat surface, your legs uncrossed, and your feet flat on the floor. The cuff of the blood pressure monitor will be placed directly against the skin of your upper arm at the level of your heart. Blood pressure should be measured at least twice using the same arm. Certain conditions can cause a difference in blood pressure between your right and left arms. If you have a high blood pressure reading during one visit or you have normal blood pressure with other risk factors, you may be asked to: Return on a different day to have your blood pressure checked again. Monitor your blood pressure at home for 1 week or longer. If you are diagnosed with hypertension, you may have other blood or imaging tests to help your health care provider understand your overall risk for other conditions. How is this treated? This condition is treated by making healthy lifestyle changes, such as eating healthy foods, exercising more, and reducing your alcohol intake. You may be referred for counseling on a healthy diet and physical activity. Your health care provider may prescribe medicine if lifestyle changes are not enough to get your blood pressure under control and if: Your systolic blood pressure is above 130. Your diastolic blood pressure is above 80. Your personal target blood pressure may vary depending on your medical conditions, your age, and other factors. Follow these instructions at home: Eating and drinking  Eat a diet that is high in fiber and potassium, and low in sodium, added sugar, and fat. An example of this eating plan is called the DASH diet. DASH stands for Dietary Approaches to Stop Hypertension. To eat this way: Eat  plenty of fresh fruits and vegetables. Try to fill one half of your plate at each meal with fruits and vegetables. Eat whole grains, such as whole-wheat pasta, brown rice, or whole-grain bread. Fill about one  fourth of your plate with whole grains. Eat or drink low-fat dairy products, such as skim milk or low-fat yogurt. Avoid fatty cuts of meat, processed or cured meats, and poultry with skin. Fill about one fourth of your plate with lean proteins, such as fish, chicken without skin, beans, eggs, or tofu. Avoid pre-made and processed foods. These tend to be higher in sodium, added sugar, and fat. Reduce your daily sodium intake. Many people with hypertension should eat less than 1,500 mg of sodium a day. Do not drink alcohol if: Your health care provider tells you not to drink. You are pregnant, may be pregnant, or are planning to become pregnant. If you drink alcohol: Limit how much you have to: 0-1 drink a day for women. 0-2 drinks a day for men. Know how much alcohol is in your drink. In the [place] one drink equals one 12 oz bottle of beer (355 mL), one 5 oz glass of wine (148 mL), or one 1 oz glass of hard liquor (44 mL). Lifestyle  Work with your health care provider to maintain a healthy body weight or to lose weight. Ask what an ideal weight is for you. Get at least 30 minutes of exercise that causes your heart to beat faster (aerobic exercise) most days of the week. Activities may include walking, swimming, or biking. Include exercise to strengthen your muscles (resistance exercise), such as Pilates or lifting weights, as part of your weekly exercise routine. Try to do these types of exercises for 30 minutes at least 3 days a week. Do not use any products that contain nicotine or tobacco. These products include cigarettes, chewing tobacco, and vaping devices, such as e-cigarettes. If you need help quitting, ask your health care provider. Monitor your blood pressure at home as told by your health care provider. Keep all follow-up visits. This is important. Medicines Take over-the-counter and prescription medicines only as told by your health care provider. Follow directions carefully. Blood  pressure medicines must be taken as prescribed. Do not skip doses of blood pressure medicine. Doing this puts you at risk for problems and can make the medicine less effective. Ask your health care provider about side effects or reactions to medicines that you should watch for. Contact a health care provider if you: Think you are having a reaction to a medicine you are taking. Have headaches that keep coming back (recurring). Feel dizzy. Have swelling in your ankles. Have trouble with your vision. Get help right away if you: Develop a severe headache or confusion. Have unusual weakness or numbness. Feel faint. Have severe pain in your chest or abdomen. Vomit repeatedly. Have trouble breathing. These symptoms may be an emergency. Get help right away. Call 911. Do not wait to see if the symptoms will go away. Do not drive yourself to the hospital. Summary Hypertension is when the force of blood pumping through your arteries is too strong. If this condition is not controlled, it may put you at risk for serious complications. Your personal target blood pressure may vary depending on your medical conditions, your age, and other factors. For most people, a normal blood pressure is less than 120/80. Hypertension is treated with lifestyle changes, medicines, or a combination of both. Lifestyle changes include losing weight, eating a healthy,  low-sodium diet, exercising more, and limiting alcohol. This information is not intended to replace advice given to you by your health care provider. Make sure you discuss any questions you have with your health care provider. Document Revised: 12/06/2020 Document Reviewed: 12/06/2020 Elsevier Patient Education  2024 [employer].

## 2023-02-25 NOTE — Assessment & Plan Note (Signed)
 BP Readings from Last 3 Encounters:  02/25/23 132/82  02/20/23 (!) 166/88  01/29/23 (!) 165/97  Blood pressure was elevated on 5 mg of amlodipine  Since increasing dose to 10 mg daily blood pressure has normalized Continue amlodipine  10 mg daily.  New prescription sent today to pharmacy of record. Cardiovascular risks associated with hypertension discussed Diet and nutrition discussed Smoking cessation discussed

## 2023-02-25 NOTE — Assessment & Plan Note (Signed)
Stable.  Pain management discussed 

## 2023-02-25 NOTE — Assessment & Plan Note (Signed)
 Diet and nutrition discussed Not taking rosuvastatin at present time

## 2023-03-13 ENCOUNTER — Encounter (HOSPITAL_COMMUNITY): Payer: Self-pay

## 2023-03-22 ENCOUNTER — Other Ambulatory Visit: Payer: Self-pay | Admitting: Emergency Medicine

## 2023-04-09 ENCOUNTER — Encounter: Payer: Self-pay | Admitting: Emergency Medicine

## 2023-04-09 ENCOUNTER — Ambulatory Visit (INDEPENDENT_AMBULATORY_CARE_PROVIDER_SITE_OTHER): Payer: Medicare Other | Admitting: Emergency Medicine

## 2023-04-09 VITALS — BP 72/42 | HR 52 | Temp 97.7°F | Ht 71.0 in | Wt 150.0 lb

## 2023-04-09 DIAGNOSIS — R001 Bradycardia, unspecified: Secondary | ICD-10-CM | POA: Diagnosis not present

## 2023-04-09 DIAGNOSIS — I959 Hypotension, unspecified: Secondary | ICD-10-CM | POA: Diagnosis not present

## 2023-04-09 DIAGNOSIS — M2550 Pain in unspecified joint: Secondary | ICD-10-CM

## 2023-04-09 NOTE — Assessment & Plan Note (Signed)
 Hypotensive and bradycardic No signs or findings of GI bleeding Differential diagnosis discussed with patient Recommend ED evaluation for further treatment Recommend to go via ambulance.  Offered to call 911 for transport Patient does not want to go to emergency department. Wants to go home.  Aware of findings and possible complications including but not limited to syncope, cardiac arrest, myocardial infarction, and death.  Still does not want to go to emergency department.

## 2023-04-09 NOTE — Progress Notes (Signed)
 Randall Velasquez 68 y.o.   Chief Complaint  Patient presents with   Follow-up    Patient here for 6 month f/u for HTN. Patient states his right hip, neck, shoulder, left leg and both hands are in constant pain states he forgot to mention it at his last visit.constant dry mouth     HISTORY OF PRESENT ILLNESS: This is a 68 y.o. male here for 48-month follow-up of hypertension.  Presently on amlodipine 10 mg daily Mostly complaining of chronic generalized pain.  Mostly to right hip, neck, shoulder, left leg, and both hands Feels weak and tired. No other complaints or medical concerns today. BP Readings from Last 3 Encounters:  02/25/23 132/82  02/20/23 (!) 166/88  01/29/23 (!) 165/97     HPI   Prior to Admission medications   Medication Sig Start Date End Date Taking? Authorizing Provider  amLODipine (NORVASC) 10 MG tablet Take 1 tablet (10 mg total) by mouth daily. 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   GERD (gastroesophageal reflux disease)    Hyperlipidemia    Hypertension     Past Surgical History:  Procedure Laterality Date   HERNIA REPAIR     as a child    Social History   Socioeconomic History   Marital status: Single    Spouse name: Not on file   Number of children: Not on file   Years of education: Not on file   Highest education level: Not on file  Occupational History   Not on file   Tobacco Use   Smoking status: Every Day    Types: Cigars   Smokeless tobacco: Never  Vaping Use   Vaping status: Never Used  Substance and Sexual Activity   Alcohol use: Yes    Alcohol/week: 2.0 standard drinks of alcohol    Types: 2 Shots of liquor per week   Drug use: Not Currently    Types: Marijuana    Comment: stopped in 20s   Sexual activity: Yes  Other Topics Concern   Not on file  Social History Narrative   Not on file   Social Drivers of Health   Financial Resource Strain: Low Risk  (08/08/2022)   Overall Financial Resource Strain (CARDIA)    Difficulty of Paying Living Expenses: Not very hard  Food Insecurity: No Food Insecurity (08/08/2022)   Hunger Vital Sign    Worried About Running Out of Food in the Last Year: Never true    Ran Out of Food in the Last Year: Never true  Transportation Needs: No Transportation Needs (08/08/2022)   PRAPARE - Administrator, Civil Service (Medical): No    Lack of Transportation (Non-Medical): No  Physical Activity: Sufficiently Active (08/08/2022)   Exercise Vital Sign    Days of Exercise per Week: 5 days    Minutes of Exercise per Session: 60 min  Stress: No Stress Concern Present (08/08/2022)  [employer] of Occupational Health - Occupational Stress Questionnaire    Feeling of Stress : Not at all  Social Connections: Moderately Isolated (08/08/2022)   Social Connection and Isolation Panel [NHANES]    Frequency of Communication with Friends and Family: More than three times a week    Frequency of Social Gatherings with Friends and Family: More than three times a week    Attends Religious Services: Never    Database administrator or Organizations: No    Attends Banker Meetings: Never    Marital Status: Living with partner  Intimate Partner Violence: Not At Risk (08/08/2022)   Humiliation, Afraid, Rape, and Kick questionnaire    Fear of Current or Ex-Partner: No    Emotionally Abused: No     Physically Abused: No    Sexually Abused: No    Family History  Problem Relation Age of Onset   Colon cancer Neg Hx    Colon polyps Neg Hx    Esophageal cancer Neg Hx    Stomach cancer Neg Hx    Rectal cancer Neg Hx      Review of Systems  Constitutional: Negative.  Negative for chills and fever.  HENT: Negative.  Negative for congestion and sore throat.   Respiratory: Negative.  Negative for cough and shortness of breath.   Cardiovascular: Negative.  Negative for chest pain and palpitations.  Gastrointestinal:  Negative for abdominal pain, nausea and vomiting.  Musculoskeletal:  Positive for back pain, joint pain and neck pain.  Skin: Negative.  Negative for rash.  Neurological:  Negative for dizziness and headaches.  All other systems reviewed and are negative.   Vitals:   04/09/23 1550  Pulse: (!) 52  SpO2: 96%    Physical Exam Vitals reviewed.  Constitutional:      Appearance: Normal appearance.  HENT:     Head: Normocephalic.     Mouth/Throat:     Mouth: Mucous membranes are moist.     Pharynx: Oropharynx is clear.  Eyes:     Extraocular Movements: Extraocular movements intact.     Pupils: Pupils are equal, round, and reactive to light.     Comments: Small pupils bilaterally  Cardiovascular:     Rate and Rhythm: Regular rhythm. Bradycardia present.     Pulses: Normal pulses.     Heart sounds: Normal heart sounds.  Pulmonary:     Effort: Pulmonary effort is normal.     Breath sounds: Normal breath sounds.  Abdominal:     Palpations: Abdomen is soft.     Tenderness: There is no abdominal tenderness.  Musculoskeletal:     Cervical back: No tenderness.  Lymphadenopathy:     Cervical: No cervical adenopathy.  Skin:    General: Skin is warm and dry.     Capillary Refill: Capillary refill takes less than 2 seconds.  Neurological:     Mental Status: He is alert and oriented to person, place, and time.  Psychiatric:        Mood and Affect: Mood normal.         Behavior: Behavior normal.    EKG: Sinus bradycardia with ventricular rate of 46.  No acute ischemic changes.  No signs of heart block  ASSESSMENT & PLAN: A total of 45 minutes was spent with the patient and counseling/coordination of care regarding preparing for this visit, review of most recent office visit notes, review of multiple chronic medical conditions and their management, differential diagnosis of hypotension and need  to go to emergency department now, review of all medications and changes made including stopping amlodipine, review of most recent bloodwork results, review of health maintenance items, education on nutrition, prognosis, documentation, and need for follow up.  Problem List Items Addressed This Visit       Cardiovascular and Mediastinum   Hypotension - Primary   Hypotensive and bradycardic No signs or findings of GI bleeding Differential diagnosis discussed with patient Recommend ED evaluation for further treatment Recommend to go via ambulance.  Offered to call 911 for transport Patient does not want to go to emergency department. Wants to go home.  Aware of findings and possible complications including but not limited to syncope, cardiac arrest, myocardial infarction, and death.  Still does not want to go to emergency department.      Sinus bradycardia   Patient not taking beta-blockers.  States he is not taking any opiates for pain but has pinpoint pupils. Advised to stop amlodipine. Advised to go to emergency department but refuses to do so EKG does not show AV blocks.      Relevant Orders   EKG 12-Lead     Other   Arthralgia of multiple joints   Active and affecting quality of life Chronic pain that will need pain management evaluation at some point Pain management discussed      Patient Instructions  Stop amlodipine Recommend to go to emergency department now for further evaluation and treatment  Hypotension As your heart beats, it  forces blood through your body. This force is called blood pressure. If you have hypotension, you have low blood pressure.  When your blood pressure is too low, you may not get enough blood to your brain or other parts of your body. This may cause you to feel weak, light-headed, have a fast heartbeat, or even faint. Low blood pressure may be harmless, or it may cause serious problems. What are the causes? Blood loss. Not enough water in the body (dehydration). Heart problems. Hormone problems. Pregnancy. A very bad infection. Not having enough of certain nutrients. Very bad allergic reactions. Certain medicines. What increases the risk? Age. The risk increases as you get older. Conditions that affect the heart or the brain and spinal cord (central nervous system). What are the signs or symptoms? Feeling: Weak. Light-headed. Dizzy. Tired (fatigued). Blurred vision. Fast heartbeat. Fainting, in very bad cases. How is this treated? Changing your diet. This may involve drinking more water or including more salt (sodium) in your diet by eating high-salt foods. Taking medicines to raise your blood pressure. Changing how much you take (the dosage) of some of your medicines. Wearing compression stockings. These stockings help to prevent blood clots and reduce swelling in your legs. In some cases, you may need to go to the hospital to: Receive fluids through an IV tube. Receive donated blood through an IV tube (transfusion). Get treated for an infection or heart problems, if this applies. Be monitored while medicines that you are taking wear off. Follow these instructions at home: Eating and drinking  Drink enough fluids to keep your pee (urine) pale yellow. Eat a healthy diet. Follow instructions from your doctor about what you can eat or drink. A healthy diet includes: Fresh fruits and vegetables. Whole grains. Low-fat (lean) meats. Low-fat dairy products. If told, include more  salt in your diet. Do not add extra salt to your diet unless your doctor tells you to. Eat small meals often. Avoid standing up quickly after you eat.  Medicines Take over-the-counter and prescription medicines only as told by your doctor. Follow instructions from your doctor about changing how much you take of your medicines, if this applies. Do not stop or change any of your medicines on your own. General instructions  Wear compression stockings as told by your doctor. Get up slowly from lying down or sitting. Avoid hot showers and a lot of heat as told by your doctor. Return to your normal activities when your doctor says that it is safe. Do not smoke or use any products that contain nicotine or tobacco. If you need help quitting, ask your doctor. Keep all follow-up visits. Contact a doctor if: You vomit. You have watery poop (diarrhea). You have a fever for more than 2-3 days. You feel more thirsty than normal. You feel weak and tired. Get help right away if: You have chest pain. You have a fast or uneven heartbeat. You lose feeling (have numbness) in any part of your body. You cannot move your arms or your legs. You have trouble talking. You get sweaty or feel light-headed. You faint. You have trouble breathing. You have trouble staying awake. You feel mixed up (confused). These symptoms may be an emergency. Get help right away. Call 911. Do not wait to see if the symptoms will go away. Do not drive yourself to the hospital. Summary Hypotension is also called low blood pressure. It is when the force of blood pumping through your body is too weak. Hypotension may be harmless, or it may cause serious problems. Treatment may include changing your diet and medicines, and wearing compression stockings. In very bad cases, you may need to go to the hospital. This information is not intended to replace advice given to you by your health care provider. Make sure you discuss any  questions you have with your health care provider. Document Revised: 09/19/2020 Document Reviewed: 09/19/2020 Elsevier Patient Education  2024 Elsevier Inc.     Edwina Barth, MD [facility]

## 2023-04-09 NOTE — Assessment & Plan Note (Signed)
 Active and affecting quality of life Chronic pain that will need pain management evaluation at some point Pain management discussed

## 2023-04-09 NOTE — Assessment & Plan Note (Signed)
 Patient not taking beta-blockers.  States he is not taking any opiates for pain but has pinpoint pupils. Advised to stop amlodipine. Advised to go to emergency department but refuses to do so EKG does not show AV blocks.

## 2023-04-09 NOTE — Patient Instructions (Signed)
 Stop amlodipine Recommend to go to emergency department now for further evaluation and treatment  Hypotension As your heart beats, it forces blood through your body. This force is called blood pressure. If you have hypotension, you have low blood pressure.  When your blood pressure is too low, you may not get enough blood to your brain or other parts of your body. This may cause you to feel weak, light-headed, have a fast heartbeat, or even faint. Low blood pressure may be harmless, or it may cause serious problems. What are the causes? Blood loss. Not enough water in the body (dehydration). Heart problems. Hormone problems. Pregnancy. A very bad infection. Not having enough of certain nutrients. Very bad allergic reactions. Certain medicines. What increases the risk? Age. The risk increases as you get older. Conditions that affect the heart or the brain and spinal cord (central nervous system). What are the signs or symptoms? Feeling: Weak. Light-headed. Dizzy. Tired (fatigued). Blurred vision. Fast heartbeat. Fainting, in very bad cases. How is this treated? Changing your diet. This may involve drinking more water or including more salt (sodium) in your diet by eating high-salt foods. Taking medicines to raise your blood pressure. Changing how much you take (the dosage) of some of your medicines. Wearing compression stockings. These stockings help to prevent blood clots and reduce swelling in your legs. In some cases, you may need to go to the hospital to: Receive fluids through an IV tube. Receive donated blood through an IV tube (transfusion). Get treated for an infection or heart problems, if this applies. Be monitored while medicines that you are taking wear off. Follow these instructions at home: Eating and drinking  Drink enough fluids to keep your pee (urine) pale yellow. Eat a healthy diet. Follow instructions from your doctor about what you can eat or drink. A  healthy diet includes: Fresh fruits and vegetables. Whole grains. Low-fat (lean) meats. Low-fat dairy products. If told, include more salt in your diet. Do not add extra salt to your diet unless your doctor tells you to. Eat small meals often. Avoid standing up quickly after you eat. Medicines Take over-the-counter and prescription medicines only as told by your doctor. Follow instructions from your doctor about changing how much you take of your medicines, if this applies. Do not stop or change any of your medicines on your own. General instructions  Wear compression stockings as told by your doctor. Get up slowly from lying down or sitting. Avoid hot showers and a lot of heat as told by your doctor. Return to your normal activities when your doctor says that it is safe. Do not smoke or use any products that contain nicotine or tobacco. If you need help quitting, ask your doctor. Keep all follow-up visits. Contact a doctor if: You vomit. You have watery poop (diarrhea). You have a fever for more than 2-3 days. You feel more thirsty than normal. You feel weak and tired. Get help right away if: You have chest pain. You have a fast or uneven heartbeat. You lose feeling (have numbness) in any part of your body. You cannot move your arms or your legs. You have trouble talking. You get sweaty or feel light-headed. You faint. You have trouble breathing. You have trouble staying awake. You feel mixed up (confused). These symptoms may be an emergency. Get help right away. Call 911. Do not wait to see if the symptoms will go away. Do not drive yourself to the hospital. Summary Hypotension is also  called low blood pressure. It is when the force of blood pumping through your body is too weak. Hypotension may be harmless, or it may cause serious problems. Treatment may include changing your diet and medicines, and wearing compression stockings. In very bad cases, you may need to go to  the hospital. This information is not intended to replace advice given to you by your health care provider. Make sure you discuss any questions you have with your health care provider. Document Revised: 09/19/2020 Document Reviewed: 09/19/2020 Elsevier Patient Education  2024 [employer].

## 2023-04-10 ENCOUNTER — Other Ambulatory Visit: Payer: Self-pay | Admitting: Radiology

## 2023-04-19 ENCOUNTER — Telehealth: Payer: Self-pay

## 2023-04-19 NOTE — Telephone Encounter (Signed)
 Copied from CRM [id number]. Topic: Clinical - Medication Question >> Apr 19, 2023 11:31 AM [place] M wrote: Reason for CRM: Patient was seen on 2/25 and was told that a pain medication was going to be called in. Please reach out to patient.

## 2023-04-21 NOTE — Telephone Encounter (Signed)
 Patient was hypotensive and bradycardic.  He was advised to go to the emergency department but refused.  Pain medication would have lowered blood pressure even more.  Call and inquire about his condition.  Should be seen in the office for follow-up soon if he did not go to emergency department as recommended.  In the meantime he can take over-the-counter Tylenol and or Advil as needed for pain.  Thanks.

## 2023-04-23 NOTE — Telephone Encounter (Signed)
 LVM for patient to call back wanted to follow up with his condition.

## 2023-05-13 ENCOUNTER — Ambulatory Visit: Payer: Self-pay

## 2023-05-13 NOTE — Telephone Encounter (Signed)
 Chief Complaint: Elevated BP Symptoms: None Frequency: Unknown Pertinent Negatives: Patient denies Cp, SOB Disposition: [x] ED /[] Urgent Care (no appt availability in office) / [] Appointment(In office/virtual)/ []  [facility]/ [] Home Care/ [] Refused Recommended Disposition /[] [place] [facility]/ []  Follow-up with PCP Additional Notes: Pt's niece called to report pt's BP this AM was reported to her by pt as 220/190. She reports pt denies symptoms, she has not noted SOB. This RN attempted to reach pt as ED advised due to elevated BP reading, LVM to return call to office, niece reports pt has been "stumbling" around recently. She reports pt states he's "fine". She notes pt has not been taking BP medications as prescribed and will refuse ED. Unable to reach pt, CAL notified. This RN educated pt on home care, new-worsening symptoms, when to call back/seek emergent care. Pt verbalized understanding and agrees to plan.    Copied from CRM [id number]. Topic: Clinical - Red Word Triage >> May 13, 2023 10:04 AM Florestine Avers wrote: Red Word that prompted transfer to Nurse Triage: Patients niece Garth Schlatter) called on behalf of the patient. She states that he has been having high blood pressure readings in the 220/190 ranges. She thinks that the patient is taking hiss medication wong and is worried he may have a stroke. The patient is not having any other symptoms. She is requesting a same day appointment for the patient to be seen. Reason for Disposition  [1] Systolic BP  >= 160 OR Diastolic >= 100 AND [2] cardiac (e.g., breathing difficulty, chest pain) or neurologic symptoms (e.g., new-onset blurred or double vision, unsteady gait)  Answer Assessment - Initial Assessment Questions 1. BLOOD PRESSURE: "What is the blood pressure?" "Did you take at least two measurements 5 minutes apart?"     220/190 2. ONSET: "When did you take your blood pressure?"     This AM 3. HOW: "How did you take your  blood pressure?" (e.g., automatic home BP monitor, visiting nurse)     Home wrist cuff 4. HISTORY: "Do you have a history of high blood pressure?"     Yes 5. MEDICINES: "Are you taking any medicines for blood pressure?" "Have you missed any doses recently?"     Pt's niece reports pt has not been taking medications as prescribed 6. OTHER SYMPTOMS: "Do you have any symptoms?" (e.g., blurred vision, chest pain, difficulty breathing, headache, weakness)     Stumbling around last week  Protocols used: Blood Pressure - High-A-AH

## 2023-05-13 NOTE — Telephone Encounter (Signed)
 Patient returning a phone call that was left by previous RN. Patient states he isn't sure if the previous BP was correct and questioning the machine. Patient states he was taken off his BP medication due to low BP. Patient endorses no headache, CP, SOB, or weakness. Patient is educated about being seen at the ED for high blood pressure and is strongly encouraged to be checked out at the ED. Patient verbalized understanding of plan and all questions answered.

## 2023-07-04 ENCOUNTER — Ambulatory Visit (INDEPENDENT_AMBULATORY_CARE_PROVIDER_SITE_OTHER)

## 2023-07-04 VITALS — BP 115/80 | HR 62 | Ht 70.5 in | Wt 145.6 lb

## 2023-07-04 DIAGNOSIS — Z0000 Encounter for general adult medical examination without abnormal findings: Secondary | ICD-10-CM

## 2023-07-04 DIAGNOSIS — F17210 Nicotine dependence, cigarettes, uncomplicated: Secondary | ICD-10-CM

## 2023-07-04 NOTE — Patient Instructions (Signed)
 Mr. Shirk , Thank you for taking time out of your busy schedule to complete your Annual Wellness Visit with me. I enjoyed our conversation and look forward to speaking with you again next year. I, as well as your care team,  appreciate your ongoing commitment to your health goals. Please review the following plan we discussed and let me know if I can assist you in the future. Your Game plan/ To Do List    Referrals: If you haven't heard from the office you've been referred to, please reach out to them at the phone provided.  [facility] Follow up Visits: Next Medicare AWV with our clinical staff: 07/08/2024.   Have you seen your provider in the last 6 months (3 months if uncontrolled diabetes)? Yes Next Office Visit with your provider: Patient stated that he will call office to schedule a 6 month follow upvisit.  Last office visit on 04/09/2023.  Clinician Recommendations:  Aim for 30 minutes of exercise or brisk walking, 6-8 glasses of water, and 5 servings of fruits and vegetables each day. You are due for a Shingles vaccine and can get this done at your local pharmacy.  You are also due for a lung cancer screening and a Hep C screening.        This is a list of the screening recommended for you and due dates:  Health Maintenance  Topic Date Due   Hepatitis C Screening  Never done   Screening for Lung Cancer  Never done   Zoster (Shingles) Vaccine (1 of 2) Never done   COVID-19 Vaccine (3 - 2024-25 season) 10/14/2022   Medicare Annual Wellness Visit  08/08/2023   Flu Shot  09/13/2023   DTaP/Tdap/Td vaccine (2 - Td or Tdap) 01/03/2030   Colon Cancer Screening  07/31/2032   Pneumonia Vaccine  Completed   HPV Vaccine  Aged Out   Meningitis B Vaccine  Aged Out    Advanced directives: (Declined) Advance directive discussed with you today. Even though you declined this today, please call our office should you change your mind, and we can give you the proper paperwork for you to fill out. Advance  Care Planning is important because it:  [x]  Makes sure you receive the medical care that is consistent with your values, goals, and preferences  [x]  It provides guidance to your family and loved ones and reduces their decisional burden about whether or not they are making the right decisions based on your wishes.  Follow the link provided in your after visit summary or read over the paperwork we have mailed to you to help you started getting your Advance Directives in place. If you need assistance in completing these, please reach out to us  so that we can help you!  See attachments for Preventive Care and Fall Prevention Tips.

## 2023-07-04 NOTE — Progress Notes (Signed)
 Subjective:   Randall Velasquez is a 68 y.o. who presents for a Medicare Wellness preventive visit.  As a reminder, Annual Wellness Visits don't include a physical exam, and some assessments may be limited, especially if this visit is performed virtually. We may recommend an in-person follow-up visit with your provider if needed.  Visit Complete: In person  Persons Participating in Visit: Patient.  AWV Questionnaire: No: Patient Medicare AWV questionnaire was not completed prior to this visit.  Cardiac Risk Factors include: advanced age (>4men, >41 women);male gender;hypertension;dyslipidemia;Other (see comment), Risk factor comments: PAD     Objective:     Today's Vitals   07/04/23 1133  Weight: 145 lb 9.6 oz (66 kg)  Height: 5' 10.5" (1.791 m)   Body mass index is 20.6 kg/m.     07/04/2023   11:36 AM 08/08/2022   11:16 AM  Advanced Directives  Does Patient Have a Medical Advance Directive? No No  Would patient like information on creating a medical advance directive?  No - Patient declined    Current Medications (verified) Outpatient Encounter Medications as of 07/04/2023  Medication Sig   amLODipine  (NORVASC ) 10 MG tablet Take 1 tablet (10 mg total) by mouth daily. (Patient not taking: Reported on 07/04/2023)   No facility-administered encounter medications on file as of 07/04/2023.    Allergies (verified) Patient has no known allergies.   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   GERD (gastroesophageal reflux disease)    Hyperlipidemia    Hypertension    Past Surgical History:  Procedure Laterality Date   HERNIA REPAIR     as a child   Family History  Problem Relation Age of Onset   Colon cancer Neg Hx    Colon polyps Neg Hx    Esophageal cancer Neg Hx    Stomach cancer Neg Hx    Rectal cancer Neg Hx    Social History   Socioeconomic History   Marital  status: Significant Other    Spouse name: Not on file   Number of children: 1   Years of education: Not on file   Highest education level: Not on file  Occupational History   Occupation: works part time  Tobacco Use   Smoking status: Some Days    Types: Cigars   Smokeless tobacco: Never  Vaping Use   Vaping status: Never Used  Substance and Sexual Activity   Alcohol use: Yes    Alcohol/week: 2.0 standard drinks of alcohol    Types: 2 Shots of liquor per week   Drug use: Not Currently    Types: Marijuana    Comment: stopped in 20s   Sexual activity: Yes  Other Topics Concern   Not on file  Social History Narrative   Lives with a partner/2025.   Social Drivers of Corporate investment banker Strain: Low Risk  (07/04/2023)   Overall Financial Resource Strain (CARDIA)    Difficulty of Paying Living Expenses: Not hard at all  Food Insecurity: No Food Insecurity (07/04/2023)   Hunger Vital Sign    Worried About Running Out of Food in the Last Year: Never true    Ran Out of Food in the Last Year: Never true  Transportation Needs: No Transportation Needs (07/04/2023)   PRAPARE - Administrator, Civil Service (Medical): No    Lack of Transportation (Non-Medical): No  Physical  Activity: Inactive (07/04/2023)   Exercise Vital Sign    Days of Exercise per Week: 0 days    Minutes of Exercise per Session: 0 min  Stress: No Stress Concern Present (07/04/2023)   [employer] of Occupational Health - Occupational Stress Questionnaire    Feeling of Stress : Not at all  Social Connections: Socially Isolated (07/04/2023)   Social Connection and Isolation Panel [NHANES]    Frequency of Communication with Friends and Family: Never    Frequency of Social Gatherings with Friends and Family: Never    Attends Religious Services: Never    Database administrator or Organizations: No    Attends Engineer, structural: Never    Marital Status: Living with partner    Tobacco  Counseling Ready to quit: Not Answered Counseling given: Not Answered    Clinical Intake:  Pre-visit preparation completed: Yes  Pain : No/denies pain     BMI - recorded: 20.6 Nutritional Status: BMI of 19-24  Normal Nutritional Risks: None Diabetes: No  No results found for: "HGBA1C"   How often do you need to have someone help you when you read instructions, pamphlets, or other written materials from your doctor or pharmacy?: 1 - Never  Interpreter Needed?: No  Information entered by :: Randall Velasquez, RMA   Activities of Daily Living     07/04/2023   11:25 AM 08/08/2022   11:14 AM  In your present state of health, do you have any difficulty performing the following activities:  Hearing? 0 0  Vision? 0 0  Difficulty concentrating or making decisions? 0 0  Walking or climbing stairs? 0 0  Dressing or bathing? 0 0  Doing errands, shopping? 0 0  Preparing Food and eating ? N N  Using the Toilet? N N  In the past six months, have you accidently leaked urine? N N  Do you have problems with loss of bowel control? N N  Managing your Medications? N N  Managing your Finances? N N  Housekeeping or managing your Housekeeping? N N    Patient Care Team: Randall Hammersmith, MD as PCP - General (Internal Medicine) Randall Jungling Tomas Fountain, MD as PCP - Cardiology (Cardiology) Randall Anchors, LCSW as Social Worker (Licensed Clinical Social Worker)  Indicate any recent [employer] you may have received from other than [facility] providers in the past year (date may be approximate).     Assessment:    This is a routine wellness examination for Randall Velasquez.  Hearing/Vision screen Hearing Screening - Comments:: Denies hearing difficulties   Vision Screening - Comments:: Denies vision issues./No current eye doctor    Goals Addressed             This Visit's Progress    just to live as long as i can   On track      Depression Screen     07/04/2023   11:40 AM 04/09/2023     3:56 PM 02/25/2023    4:05 PM 10/10/2022    3:44 PM 08/08/2022   11:19 AM 05/09/2022   10:46 AM 02/08/2022   10:11 AM  PHQ 2/9 Scores  PHQ - 2 Score 0 0 0 0 0 0 0  PHQ- 9 Score 0    0      Fall Risk     07/04/2023   11:37 AM 04/09/2023    3:56 PM 02/25/2023    4:05 PM 10/10/2022    3:44 PM 08/08/2022   11:16 AM  Fall Risk   Falls in the past year? 1 0 0 0 0  Number falls in past yr: 0 0 0 0 0  Injury with Fall? 0 0 0 0 0  Risk for fall due to : Impaired balance/gait No Fall Risks No Fall Risks No Fall Risks No Fall Risks  Follow up Falls evaluation completed;Falls prevention discussed Falls evaluation completed Falls evaluation completed Falls evaluation completed Falls evaluation completed    MEDICARE RISK AT HOME:  Medicare Risk at Home Any stairs in or around the home?: No Home free of loose throw rugs in walkways, pet beds, electrical cords, etc?: Yes Adequate lighting in your home to reduce risk of falls?: Yes Use of a cane, walker or w/c?: No Grab bars in the bathroom?: No Shower chair or bench in shower?: No Elevated toilet seat or a handicapped toilet?: No  TIMED UP AND GO:  Was the test performed?  Yes  Length of time to ambulate 10 feet: 15 sec Gait slow and steady without use of assistive device  Cognitive Function: Declined/Normal: No cognitive concerns noted by patient or family. Patient alert, oriented, able to answer questions appropriately and recall recent events. No signs of memory loss or confusion.        08/08/2022   11:20 AM  6CIT Screen  What Year? 0 points  What month? 0 points  What time? 0 points  Count back from 20 0 points  Months in reverse 4 points  Repeat phrase 2 points  Total Score 6 points    Immunizations Immunization History  Administered Date(s) Administered   Fluad Quad(high Dose 65+) 02/08/2022   Fluad Trivalent(High Dose 65+) 10/10/2022   Influenza,inj,quad, With Preservative 01/04/2020   PFIZER(Purple Top)SARS-COV-2  Vaccination 02/14/2020, 03/09/2020   PNEUMOCOCCAL CONJUGATE-20 06/29/2021   Tdap 01/04/2020    Screening Tests Health Maintenance  Topic Date Due   Hepatitis C Screening  Never done   Lung Cancer Screening  Never done   Zoster Vaccines- Shingrix  (1 of 2) Never done   COVID-19 Vaccine (3 - 2024-25 season) 10/14/2022   Medicare Annual Wellness (AWV)  08/08/2023   INFLUENZA VACCINE  09/13/2023   DTaP/Tdap/Td (2 - Td or Tdap) 01/03/2030   Colonoscopy  07/31/2032   Pneumonia Vaccine 35+ Years old  Completed   HPV VACCINES  Aged Out   Meningococcal B Vaccine  Aged Out    Health Maintenance  Health Maintenance Due  Topic Date Due   Hepatitis C Screening  Never done   Lung Cancer Screening  Never done   Zoster Vaccines- Shingrix  (1 of 2) Never done   COVID-19 Vaccine (3 - 2024-25 season) 10/14/2022   Medicare Annual Wellness (AWV)  08/08/2023   Health Maintenance Items Addressed: Lung Cancer Screening ordered, Hepatitis C Screening, See Nurse Notes  Additional Screening:  Vision Screening: Recommended annual ophthalmology exams for early detection of glaucoma and other disorders of the eye.  Dental Screening: Recommended annual dental exams for proper oral hygiene  Community Resource Referral / Chronic Care Management: CRR required this visit?  No   CCM required this visit?  No   Plan:    I have personally reviewed and noted the following in the patient's chart:   Medical and social history Use of alcohol, tobacco or illicit drugs  Current medications and supplements including opioid prescriptions. Patient is not currently taking opioid prescriptions. Functional ability and status Nutritional status Physical activity Advanced directives List of other physicians Hospitalizations, surgeries, and ER visits  in previous 12 months Vitals Screenings to include cognitive, depression, and falls Referrals and appointments  In addition, I have reviewed and discussed with  patient certain preventive protocols, quality metrics, and best practice recommendations. A written personalized care plan for preventive services as well as general preventive health recommendations were provided to patient.   [place] Latulippe L Avannah Decker, CMA   07/04/2023   After Visit Summary: (MyChart) Due to this being a telephonic visit, the after visit summary with patients personalized plan was offered to patient via MyChart   Notes: Please refer to Routing Comments.

## 2023-09-04 ENCOUNTER — Ambulatory Visit (INDEPENDENT_AMBULATORY_CARE_PROVIDER_SITE_OTHER)

## 2023-09-04 ENCOUNTER — Encounter: Payer: Self-pay | Admitting: Family Medicine

## 2023-09-04 ENCOUNTER — Ambulatory Visit (INDEPENDENT_AMBULATORY_CARE_PROVIDER_SITE_OTHER): Admitting: Family Medicine

## 2023-09-04 ENCOUNTER — Telehealth: Payer: Self-pay

## 2023-09-04 VITALS — BP 132/84 | Ht 70.5 in | Wt 154.0 lb

## 2023-09-04 DIAGNOSIS — M5416 Radiculopathy, lumbar region: Secondary | ICD-10-CM | POA: Insufficient documentation

## 2023-09-04 DIAGNOSIS — I739 Peripheral vascular disease, unspecified: Secondary | ICD-10-CM

## 2023-09-04 DIAGNOSIS — M5136 Other intervertebral disc degeneration, lumbar region with discogenic back pain only: Secondary | ICD-10-CM | POA: Diagnosis not present

## 2023-09-04 DIAGNOSIS — M48061 Spinal stenosis, lumbar region without neurogenic claudication: Secondary | ICD-10-CM | POA: Diagnosis not present

## 2023-09-04 MED ORDER — TRAMADOL HCL 50 MG PO TABS: 50.0000 mg | ORAL_TABLET | Freq: Three times a day (TID) | ORAL | 0 refills | Status: AC | PRN

## 2023-09-04 NOTE — Telephone Encounter (Signed)
 Placing an order for a vasc US . Please check for pre-cert

## 2023-09-04 NOTE — Progress Notes (Signed)
   I, Leotis Batter, CMA acting as a scribe for [name], MD.  Randall Velasquez is a 68 y.o. male who presents to [facility] today for low back and hip pain. Pt was last seen by Dr. Lloyd on 02/20/23 for lumbar radiculitis.  Last lumbar ESI, 01/29/23  Today, pt reports continued lower back, bilat hips, and bilat leg pain. Pt locates pain to bilateral lower back radiating into the hips and legs. Only got about 3 days of relief with lats back injection. Taking IBU prn for pain, would like to discuss Rx for breakthrough pain. Aching in the calves and thighs, feel fatigued. Also c/o neck and left shoulder pain.   Radiating pain: hips, legs LE numbness/tingling:  B feet LE weakness: B LE Aggravates: bending twisting Treatments tried: IBU  Dx imaging: 11/30/20 L-spine MRI  09/27/20 L-spine XR  Pertinent review of systems: No fevers or chills  Relevant historical information: History of PAD   Exam:  BP 132/84   Ht 5' 10.5 (1.791 m)   Wt 154 lb (69.9 kg)   BMI 21.78 kg/m  General: Well Developed, well nourished, and in no acute distress.   MSK: L-spine nontender palpation spinal midline. Normal lumbar motion larceny strength is intact. Decreased pulses bilateral dorsal pedis.    Lab and Radiology Results  X-ray images lumbar spine obtained today personally and independently interpreted. Mild scoliosis.  Moderate degenerative changes especially prominent at the L4-L5 level and L5-S1 level.  No acute fractures are visible. Await formal radiology review     Assessment and Plan: 68 y.o. male with chronic bilateral lower extremity pain due to either claudication or neurogenic claudication or both.  Plan to reorder lumbar spine MRI and ABI.  If ABI positive refer to vascular surgery.  If lumbar spine MRI significant for spinal stenosis or nerve impingement proceed to epidural steroid injection. Tramadol  prescribed for pain control.   PDMP  reviewed during this encounter. Orders Placed This Encounter  Procedures   MR Lumbar Spine Wo Contrast    Standing Status:   Future    Expiration Date:   09/03/2024    What is the patient's sedation requirement?:   No Sedation    Does the patient have a pacemaker or implanted devices?:   No    Preferred imaging location?:   [facility] (table limit-550lbs)   DG Lumbar Spine 2-3 Views    Standing Status:   Future    Number of Occurrences:   1    Expiration Date:   10/05/2023    Reason for Exam (SYMPTOM  OR DIAGNOSIS REQUIRED):   low back pain    Preferred imaging location?:   [place] [facility] ordered this encounter  Medications   traMADol  (ULTRAM ) 50 MG tablet    Sig: Take 1 tablet (50 mg total) by mouth every 8 (eight) hours as needed for severe pain (pain score 7-10).    Dispense:  15 tablet    Refill:  0     Discussed warning signs or symptoms. Please see discharge instructions. Patient expresses understanding.   The above documentation has been reviewed and is accurate and complete [name], M.D.

## 2023-09-04 NOTE — Patient Instructions (Addendum)
 Thank you for coming in today.   Please get an Xray today before you leave   You should hear from MRI scheduling within 1 week. If you do not hear please let me know.    You have a Vascular Ultrasound scheduled for:  [facility] [address], [address] [place], [place] [address]

## 2023-09-06 ENCOUNTER — Ambulatory Visit (HOSPITAL_COMMUNITY)
Admission: RE | Admit: 2023-09-06 | Discharge: 2023-09-06 | Disposition: A | Discharge status: Home / Self Care | Source: Ambulatory Visit | Attending: Family Medicine | Admitting: Family Medicine

## 2023-09-06 DIAGNOSIS — I739 Peripheral vascular disease, unspecified: Secondary | ICD-10-CM | POA: Diagnosis not present

## 2023-09-06 DIAGNOSIS — M5416 Radiculopathy, lumbar region: Secondary | ICD-10-CM | POA: Insufficient documentation

## 2023-09-06 LAB — VAS US ABI WITH/WO TBI
Left ABI: 0.61
Right ABI: 1.04

## 2023-09-09 ENCOUNTER — Ambulatory Visit: Payer: Self-pay | Admitting: Family Medicine

## 2023-09-09 DIAGNOSIS — I739 Peripheral vascular disease, unspecified: Secondary | ICD-10-CM

## 2023-09-09 DIAGNOSIS — M79604 Pain in right leg: Secondary | ICD-10-CM

## 2023-09-09 DIAGNOSIS — M5416 Radiculopathy, lumbar region: Secondary | ICD-10-CM

## 2023-09-09 NOTE — Progress Notes (Signed)
 Artery ultrasound test does show reduced blood flow.  I am going to refer you to vascular surgery.

## 2023-09-11 NOTE — Progress Notes (Signed)
 Lumbar spine x-ray does not show any significant abnormalities.

## 2023-09-12 IMAGING — XA Imaging study
2 series · 2 of 2 positions shown · non-contrast
Comparison: none

CLINICAL DATA: Disc herniation L4-5 with right lateral recess
stenosis. Good response to initial right L5 nerve root block but
with recurrence of symptoms. Request for different technique today.

[Series 1: ortho standard · 1 of 1 slices shown (1 of 2)]
[im 1/1]
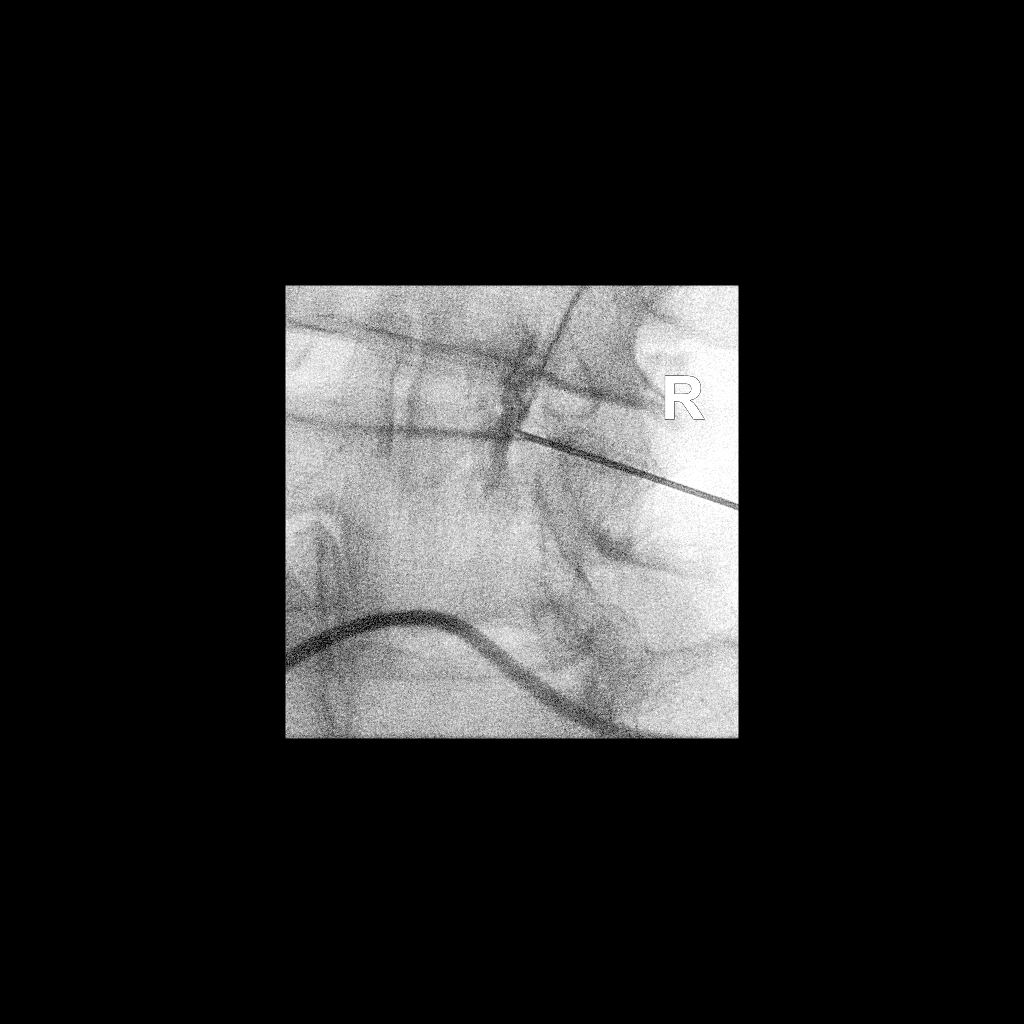

[Series 2: ortho standard · 1 of 1 slices shown (2 of 2)]
[im 1/1]
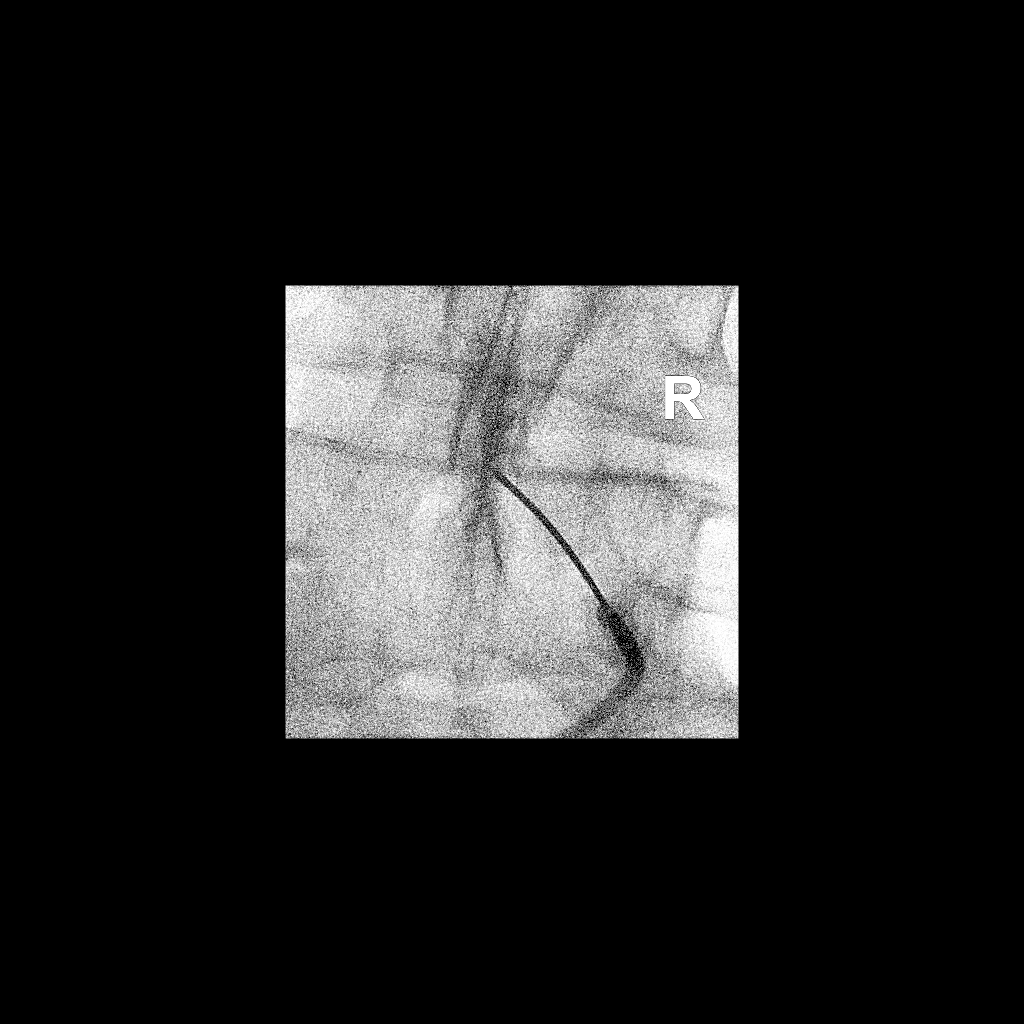

[2 of 2 positions shown; findings below may reference images not displayed]

FLUOROSCOPY:
Radiation Exposure Index (as provided by the fluoroscopic device): 0
minutes 44 seconds. 13.18 micro gray meter squared

PROCEDURE:
The procedure, risks, benefits, and alternatives were explained to
the patient. Questions regarding the procedure were encouraged and
answered. The patient understands and consents to the procedure.

LUMBAR EPIDURAL INJECTION:

An interlaminar approach was performed on the right at L4-5. The
overlying skin was cleansed and anesthetized. A 20 gauge epidural
needle was advanced using loss-of-resistance technique.

DIAGNOSTIC EPIDURAL INJECTION:

Injection of Isovue-M 200 shows a good epidural pattern with spread
above and below the level of needle placement, primarily on the
right. No vascular opacification is seen.

THERAPEUTIC EPIDURAL INJECTION:

Eighty mg of Depo-Medrol mixed with 2 cc 1% lidocaine were
instilled. The procedure was well-tolerated, and the patient was
discharged thirty minutes following the injection in good condition.

COMPLICATIONS:
None
IMPRESSION: Technically successful epidural injection on the right at L4-5.

## 2023-09-13 ENCOUNTER — Ambulatory Visit
Admission: RE | Admit: 2023-09-13 | Discharge: 2023-09-13 | Disposition: A | Discharge status: Home / Self Care | Source: Ambulatory Visit | Attending: Family Medicine | Admitting: Family Medicine

## 2023-09-13 DIAGNOSIS — M5416 Radiculopathy, lumbar region: Secondary | ICD-10-CM

## 2023-09-13 DIAGNOSIS — M4726 Other spondylosis with radiculopathy, lumbar region: Secondary | ICD-10-CM | POA: Diagnosis not present

## 2023-09-13 DIAGNOSIS — M5116 Intervertebral disc disorders with radiculopathy, lumbar region: Secondary | ICD-10-CM | POA: Diagnosis not present

## 2023-09-13 DIAGNOSIS — I739 Peripheral vascular disease, unspecified: Secondary | ICD-10-CM

## 2023-09-20 NOTE — Telephone Encounter (Signed)
 Per DPR, provided Dr. Keller result note and treatment plan to pt's niece. Lumbar ESI order placed.

## 2023-09-23 ENCOUNTER — Inpatient Hospital Stay: Admission: RE | Admit: 2023-09-23 | Source: Ambulatory Visit

## 2023-09-30 NOTE — Discharge Instructions (Signed)

## 2023-10-01 ENCOUNTER — Ambulatory Visit
Admission: RE | Admit: 2023-10-01 | Discharge: 2023-10-01 | Disposition: A | Discharge status: Home / Self Care | Source: Ambulatory Visit | Attending: Sports Medicine | Admitting: Sports Medicine

## 2023-10-01 DIAGNOSIS — M79604 Pain in right leg: Secondary | ICD-10-CM

## 2023-10-01 DIAGNOSIS — M4726 Other spondylosis with radiculopathy, lumbar region: Secondary | ICD-10-CM | POA: Diagnosis not present

## 2023-10-01 DIAGNOSIS — M5416 Radiculopathy, lumbar region: Secondary | ICD-10-CM

## 2023-10-01 MED ORDER — IOPAMIDOL (ISOVUE-M 200) INJECTION 41%
1.0000 mL | Freq: Once | INTRAMUSCULAR | Status: AC
  Administered 2023-10-01: 1 mL via EPIDURAL

## 2023-10-01 MED ORDER — METHYLPREDNISOLONE ACETATE 40 MG/ML INJ SUSP (RADIOLOG
80.0000 mg | Freq: Once | INTRAMUSCULAR | Status: AC
  Administered 2023-10-01: 80 mg via EPIDURAL

## 2023-10-17 ENCOUNTER — Ambulatory Visit: Attending: Vascular Surgery | Admitting: Physician Assistant

## 2023-10-17 VITALS — BP 189/107 | HR 71 | Temp 98.1°F | Wt 149.9 lb

## 2023-10-17 DIAGNOSIS — I70212 Atherosclerosis of native arteries of extremities with intermittent claudication, left leg: Secondary | ICD-10-CM | POA: Diagnosis not present

## 2023-10-17 DIAGNOSIS — I739 Peripheral vascular disease, unspecified: Secondary | ICD-10-CM

## 2023-10-17 MED ORDER — SIMVASTATIN 20 MG PO TABS: 20.0000 mg | ORAL_TABLET | Freq: Every day | ORAL | 0 refills | Status: AC

## 2023-10-17 MED ORDER — ASPIRIN 81 MG PO TBEC: 81.0000 mg | DELAYED_RELEASE_TABLET | Freq: Every day | ORAL | 12 refills | Status: AC

## 2023-10-17 NOTE — Progress Notes (Signed)
 Office Note     CC:  follow up Requesting Provider:  Joane Artist RAMAN, MD  HPI: Randall Velasquez is a 68 y.o. (07/21/1955) male who presents for evaluation of left lower extremity claudication.  He was referred by his PCP after an abnormal ABI study.  He was seen by Dr. Magda 3 years ago with the same symptoms.  Patient states the symptoms have not improved or worsened.  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  He denies any rest pain or tissue loss.  He also has lumbar spine pathology.  In the last month he received a back injection however does not seem to help.  He has numbness in mainly the left leg but also sometimes in the right leg.  He smokes 1 to 2 cigars daily.   Past Medical History:  Diagnosis Date   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   GERD (gastroesophageal reflux disease)    Hyperlipidemia    Hypertension     Past Surgical History:  Procedure Laterality Date   HERNIA REPAIR     as a child    Social History   Socioeconomic History   Marital status: Significant Other    Spouse name: Not on file   Number of children: 1   Years of education: Not on file   Highest education level: Not on file  Occupational History   Occupation: works part time  Tobacco Use   Smoking status: Some Days    Types: Cigars   Smokeless tobacco: Never  Vaping Use   Vaping status: Never Used  Substance and Sexual Activity   Alcohol use: Yes    Alcohol/week: 2.0 standard drinks of alcohol    Types: 2 Shots of liquor per week   Drug use: Not Currently    Types: Marijuana    Comment: stopped in 20s   Sexual activity: Yes  Other Topics Concern   Not on file  Social History Narrative   Lives with a partner/2025.   Social Drivers of Corporate investment banker Strain: Low Risk  (07/04/2023)   Overall Financial Resource Strain (CARDIA)    Difficulty of Paying Living Expenses: Not hard at all   Food Insecurity: No Food Insecurity (07/04/2023)   Hunger Vital Sign    Worried About Running Out of Food in the Last Year: Never true    Ran Out of Food in the Last Year: Never true  Transportation Needs: No Transportation Needs (07/04/2023)   PRAPARE - Administrator, Civil Service (Medical): No    Lack of Transportation (Non-Medical): No  Physical Activity: Inactive (07/04/2023)   Exercise Vital Sign    Days of Exercise per Week: 0 days    Minutes of Exercise per Session: 0 min  Stress: No Stress Concern Present (07/04/2023)   [employer] of Occupational Health - Occupational Stress Questionnaire    Feeling of Stress : Not at all  Social Connections: Socially Isolated (07/04/2023)   Social Connection and Isolation Panel    Frequency of Communication with Friends and Family: Never    Frequency of Social Gatherings with Friends and Family: Never    Attends Religious Services: Never    Database administrator or Organizations: No    Attends Banker Meetings: Never    Marital Status: Living with partner  Intimate Partner Violence: Not At Risk (07/04/2023)  Humiliation, Afraid, Rape, and Kick questionnaire    Fear of Current or Ex-Partner: No    Emotionally Abused: No    Physically Abused: No    Sexually Abused: No    Family History  Problem Relation Age of Onset   Colon cancer Neg Hx    Colon polyps Neg Hx    Esophageal cancer Neg Hx    Stomach cancer Neg Hx    Rectal cancer Neg Hx     Current Outpatient Medications  Medication Sig Dispense Refill   amLODipine  (NORVASC ) 10 MG tablet Take 1 tablet (10 mg total) by mouth daily. 90 tablet 3   aspirin  EC 81 MG tablet Take 1 tablet (81 mg total) by mouth daily. Swallow whole. 30 tablet 12   simvastatin  (ZOCOR ) 20 MG tablet Take 1 tablet (20 mg total) by mouth daily. 30 tablet 0   traMADol  (ULTRAM ) 50 MG tablet Take 1 tablet (50 mg total) by mouth every 8 (eight) hours as needed for severe pain (pain  score 7-10). 15 tablet 0   No current facility-administered medications for this visit.    No Known Allergies   REVIEW OF SYSTEMS:  Negative unless noted in HPI [X]  denotes positive finding, [ ]  denotes negative finding Cardiac  Comments:  Chest pain or chest pressure:    Shortness of breath upon exertion:    Short of breath when lying flat:    Irregular heart rhythm:        Vascular    Pain in calf, thigh, or hip brought on by ambulation:    Pain in feet at night that wakes you up from your sleep:     Blood clot in your veins:    Leg swelling:         Pulmonary    Oxygen at home:    Productive cough:     Wheezing:         Neurologic    Sudden weakness in arms or legs:     Sudden numbness in arms or legs:     Sudden onset of difficulty speaking or slurred speech:    Temporary loss of vision in one eye:     Problems with dizziness:         Gastrointestinal    Blood in stool:     Vomited blood:         Genitourinary    Burning when urinating:     Blood in urine:        Psychiatric    Major depression:         Hematologic    Bleeding problems:    Problems with blood clotting too easily:        Skin    Rashes or ulcers:        Constitutional    Fever or chills:      PHYSICAL EXAMINATION:  Vitals:   10/17/23 0917  BP: (!) 189/107  Pulse: 71  Temp: 98.1 F (36.7 C)  TempSrc: Temporal  Weight: 149 lb 14.4 oz (68 kg)    General:  WDWN in NAD; vital signs documented above Gait: Not observed HENT: WNL, normocephalic Pulmonary: normal non-labored breathing Cardiac: regular HR Abdomen: soft, NT, no masses Skin: without rashes Vascular Exam/Pulses: Palpable right PT; absent left pedal pulses Extremities: without ischemic changes, without Gangrene , without cellulitis; without open wounds;  Musculoskeletal: no muscle wasting or atrophy  Neurologic: A&O X 3 Psychiatric:  The pt has Normal affect.   Non-Invasive Vascular  Imaging:    ABI/TBIToday's  ABIToday's TBIPrevious ABIPrevious TBI  +-------+-----------+-----------+------------+------------+  Right 1.04       0.73                                 +-------+-----------+-----------+------------+------------+  Left  0.61       0.54                                 +-------+-----------+-----------+------------+-----------     ASSESSMENT/PLAN:: 68 y.o. male here for evaluation of claudication symptoms  Mr. Gelles is a 68 year old male who was seen in our office about 3 years ago for the same symptoms.  He believes the symptoms are stable and have not worsened or improved.  Left ABI and TBI are nearly identical to 3 years ago.  He continues to be without rest pain or tissue loss of the left lower extremity.  Recommend continued conservative management at this time.  He will continue walking for exercise and for work.  We discussed smoking cessation.  I have also started him on a daily 81 mg aspirin  as well as a statin.  We will repeat ABI/TBI again in 1 year.  He will notify the office if claudication symptoms worsen or if he develops rest pain or tissue loss.   Donnice Sender, PA-C [facility] [phone]  Clinic MD:   [place]

## 2024-01-06 ENCOUNTER — Ambulatory Visit (INDEPENDENT_AMBULATORY_CARE_PROVIDER_SITE_OTHER): Admitting: Family Medicine

## 2024-01-06 VITALS — BP 148/70 | HR 92 | Ht 70.5 in | Wt 150.0 lb

## 2024-01-06 DIAGNOSIS — M79604 Pain in right leg: Secondary | ICD-10-CM | POA: Diagnosis not present

## 2024-01-06 DIAGNOSIS — M79605 Pain in left leg: Secondary | ICD-10-CM | POA: Diagnosis not present

## 2024-01-06 DIAGNOSIS — I739 Peripheral vascular disease, unspecified: Secondary | ICD-10-CM | POA: Diagnosis not present

## 2024-01-06 NOTE — Progress Notes (Signed)
 I, Claretha Schimke am a scribe for Dr. [name], MD.  Randall Velasquez is a 68 y.o. male who presents to [facility] at [facility] today for exacerbation of his LBP and L knee pain. Pt was last seen by Dr. Lloyd on 09/04/23 and was prescribed tramadol , l-spine MRI and ABI ordered.  Based on findings, he was referred to vascular surgery and lumbar ESI ordered, done on 8/19.  Today, pt reports that he isn't hurting right this minute. By the end of the day he will be hurting due to the walking around and regular activity. Some times the pain is keeping him up at night but not all the time. Not much improvement in the pain. The medication (tramadol ) given last time made him itch and break out in hives after the first three days he stopped.   His left leg pain is worse with activity and better with rest.  He notes the back injection did not help much.  He did have a follow-up appointment with vascular surgery in September were significant claudication was noted.  He is not currently taking statin or blood pressure medication.  He does sometimes take low-dose aspirin .  Dx testing:  09/13/23 L-spine MRI  09/06/23 Vasc US  ABI 11/30/20 L-spine MRI             09/27/20 L-spine XR  Pertinent review of systems: No fevers or chills  Relevant historical information: PAD   Exam:  BP (!) 148/70   Pulse 92   Ht 5' 10.5 (1.791 m)   Wt 150 lb (68 kg)   BMI 21.22 kg/m  General: Well Developed, well nourished, and in no acute distress.   MSK: L-spine: Normal.  Decreased lumbar motion lower extremity strength is intact. Reflexes are intact.  Pulses reduced left leg   Lab and Radiology Results  EXAM: MRI LUMBAR SPINE 09/13/2023 07:05:20 AM   TECHNIQUE: Multiplanar multisequence MRI of the lumbar spine was performed without the administration of intravenous contrast.   COMPARISON: MRI of the lumbar spine 11/30/2020.   CLINICAL HISTORY: Lumbar radiculopathy,  symptoms persist with > 6 wks treatment; bilateral leg weakness. Low back and left leg pain x 3 months; Hx of injection 3 yrs ago; No surgery.   FINDINGS:   BONES AND ALIGNMENT: Levoconvex curvature of the lumbar spine is centered at L4-5. Slight degenerative retrolisthesis at L3-4 and L4-5 is stable. 2 mm retrolisthesis at L2-3 is new.   SPINAL CORD: Conus medullaris terminates at L1-2.   SOFT TISSUES: No paraspinal mass.   L1-L2: Progressive facet hypertrophy is present. Mild leftward disc bulging is present without significant stenosis.   L2-L3: A leftward disc protrusion is present. Moderate facet hypertrophy has progressed. No significant stenosis is present.   L3-L4: A leftward disc protrusion is present. Moderate facet hypertrophy has progressed bilaterally. Mild left foraminal narrowing is similar to prior study.   L4-L5: A rightward disc protrusion is similar to the prior study. Mild right subarticular and foraminal narrowing and stable.   L5-S1: Chronic loss of disc height is again noted. A broad-based disc protrusion is present. Mild right foraminal narrowing is stable.   IMPRESSION: 1. New 2 mm retrolisthesis at L2-3. 2. Mild leftward disc bulging progressive bilateral facet hypertrophy at L1-L2 and L2-L3 without significant stenosis. 3. Leftward disc protrusion at L3-L4 with progressive bilateral facet hypertrophy and mild left foraminal narrowing, similar to prior study. 4. Rightward disc protrusion at L4-L5 with mild right subarticular and foraminal narrowing,  stable. 5. Broad-based disc protrusion at L5-S1 with mild right foraminal narrowing, stable.   Electronically signed by: Lonni Necessary MD 09/20/2023 06:19 AM EDT RP Workstation: [id number] I, [name], personally (independently) visualized and performed the interpretation of the images attached in this note.  ABI September 06, 2023 Summary:  Right: Resting right ankle-brachial index is  within normal range. The  right toe-brachial index is normal.   Left: Resting left ankle-brachial index indicates moderate left lower  extremity arterial disease. The left toe-brachial index is abnormal.   Assessment and Plan: 68 y.o. male with left leg pain.  Differential includes lumbar radiculopathy and claudication.  His MRI shows worse right sided nerve impingement than left.  He does have much worse left-sided PAD than right.  His symptoms are worse with activity and better with rest.  All of this would point to his left leg symptoms and pain being due to claudication and not lumbar radiculopathy.  His claudication symptoms are intermittent and currently relatively mild.  Plan for continued watchful waiting and follow back up with vascular surgery if claudication symptoms worsen.  At that point he would be a good candidate for some procedure.  Right now he would like to avoid surgery or stenting.  Also of note is he is not taking his statins or blood pressure medicine.  Recommend checking back with primary care provider to get started back on hypertension control and cholesterol control.   PDMP not reviewed this encounter. No orders of the defined types were placed in this encounter.  No orders of the defined types were placed in this encounter.    Discussed warning signs or symptoms. Please see discharge instructions. Patient expresses understanding.   The above documentation has been reviewed and is accurate and complete [name], M.D.  Total encounter time 30 minutes including face-to-face time with the patient and, reviewing past medical record, and charting on the date of service.

## 2024-01-06 NOTE — Patient Instructions (Signed)
 Thank you for coming in today.   Check back as needed  Recommend scheduling an appointment with your primary care provider, Dr. Purcell  If leg pain worsens, contact your vascular surgeon.

## 2024-07-08 ENCOUNTER — Ambulatory Visit
